# Patient Record
Sex: Female | Born: 1950 | Race: White | Hispanic: No | Smoking: Never smoker
Health system: Southern US, Community
[De-identification: ages and names within clinical notes are randomized; demographics above are authoritative.]

## PROBLEM LIST (undated history)

## (undated) DIAGNOSIS — IMO0002 Reserved for concepts with insufficient information to code with codable children: Secondary | ICD-10-CM

## (undated) DIAGNOSIS — Z9221 Personal history of antineoplastic chemotherapy: Secondary | ICD-10-CM

## (undated) DIAGNOSIS — K589 Irritable bowel syndrome without diarrhea: Secondary | ICD-10-CM

## (undated) DIAGNOSIS — Q249 Congenital malformation of heart, unspecified: Secondary | ICD-10-CM

## (undated) DIAGNOSIS — I10 Essential (primary) hypertension: Secondary | ICD-10-CM

## (undated) DIAGNOSIS — G43909 Migraine, unspecified, not intractable, without status migrainosus: Secondary | ICD-10-CM

## (undated) DIAGNOSIS — N83209 Unspecified ovarian cyst, unspecified side: Secondary | ICD-10-CM

## (undated) DIAGNOSIS — Z923 Personal history of irradiation: Secondary | ICD-10-CM

## (undated) DIAGNOSIS — C50919 Malignant neoplasm of unspecified site of unspecified female breast: Secondary | ICD-10-CM

## (undated) DIAGNOSIS — E7800 Pure hypercholesterolemia, unspecified: Secondary | ICD-10-CM

## (undated) DIAGNOSIS — R0981 Nasal congestion: Secondary | ICD-10-CM

## (undated) DIAGNOSIS — K219 Gastro-esophageal reflux disease without esophagitis: Secondary | ICD-10-CM

## (undated) DIAGNOSIS — C50511 Malignant neoplasm of lower-outer quadrant of right female breast: Principal | ICD-10-CM

## (undated) DIAGNOSIS — M722 Plantar fascial fibromatosis: Secondary | ICD-10-CM

## (undated) DIAGNOSIS — K449 Diaphragmatic hernia without obstruction or gangrene: Secondary | ICD-10-CM

## (undated) HISTORY — DX: Gastro-esophageal reflux disease without esophagitis: K21.9

## (undated) HISTORY — DX: Diaphragmatic hernia without obstruction or gangrene: K44.9

## (undated) HISTORY — DX: Pure hypercholesterolemia, unspecified: E78.00

## (undated) HISTORY — DX: Unspecified ovarian cyst, unspecified side: N83.209

## (undated) HISTORY — DX: Reserved for concepts with insufficient information to code with codable children: IMO0002

## (undated) HISTORY — PX: COLONOSCOPY: SHX174

## (undated) HISTORY — DX: Malignant neoplasm of lower-outer quadrant of right female breast: C50.511

## (undated) HISTORY — DX: Migraine, unspecified, not intractable, without status migrainosus: G43.909

## (undated) HISTORY — DX: Irritable bowel syndrome, unspecified: K58.9

## (undated) HISTORY — DX: Plantar fascial fibromatosis: M72.2

## (undated) HISTORY — PX: NASAL SINUS SURGERY: SHX719

## (undated) HISTORY — DX: Congenital malformation of heart, unspecified: Q24.9

## (undated) HISTORY — PX: APPENDECTOMY: SHX54

## (undated) HISTORY — DX: Essential (primary) hypertension: I10

---

## 1990-03-12 HISTORY — PX: ABDOMINAL HYSTERECTOMY: SHX81

## 1996-03-12 HISTORY — PX: BREAST EXCISIONAL BIOPSY: SUR124

## 1996-03-12 HISTORY — PX: BREAST CYST EXCISION: SHX579

## 1998-02-09 ENCOUNTER — Other Ambulatory Visit: Admission: RE | Admit: 1998-02-09 | Discharge: 1998-02-09 | Payer: Self-pay | Admitting: Gynecology

## 1999-01-03 ENCOUNTER — Encounter: Payer: Self-pay | Admitting: Gynecology

## 1999-01-03 ENCOUNTER — Encounter: Admission: RE | Admit: 1999-01-03 | Discharge: 1999-01-03 | Payer: Self-pay | Admitting: Gynecology

## 1999-02-15 ENCOUNTER — Other Ambulatory Visit: Admission: RE | Admit: 1999-02-15 | Discharge: 1999-02-15 | Payer: Self-pay | Admitting: Gynecology

## 2000-01-09 ENCOUNTER — Encounter: Admission: RE | Admit: 2000-01-09 | Discharge: 2000-01-09 | Payer: Self-pay | Admitting: Gynecology

## 2000-01-09 ENCOUNTER — Encounter: Payer: Self-pay | Admitting: Gynecology

## 2000-02-16 ENCOUNTER — Other Ambulatory Visit: Admission: RE | Admit: 2000-02-16 | Discharge: 2000-02-16 | Payer: Self-pay | Admitting: Gynecology

## 2000-03-12 HISTORY — PX: ESOPHAGOGASTRODUODENOSCOPY: SHX1529

## 2000-07-16 ENCOUNTER — Ambulatory Visit (HOSPITAL_COMMUNITY): Admission: RE | Admit: 2000-07-16 | Discharge: 2000-07-16 | Payer: Self-pay | Admitting: Gastroenterology

## 2000-07-16 ENCOUNTER — Encounter (INDEPENDENT_AMBULATORY_CARE_PROVIDER_SITE_OTHER): Payer: Self-pay

## 2000-09-23 ENCOUNTER — Encounter: Admission: RE | Admit: 2000-09-23 | Discharge: 2000-09-23 | Payer: Self-pay | Admitting: Family Medicine

## 2000-09-23 ENCOUNTER — Encounter: Payer: Self-pay | Admitting: Family Medicine

## 2001-01-09 ENCOUNTER — Encounter: Admission: RE | Admit: 2001-01-09 | Discharge: 2001-01-09 | Payer: Self-pay | Admitting: Gynecology

## 2001-01-09 ENCOUNTER — Encounter: Payer: Self-pay | Admitting: Gynecology

## 2001-02-20 ENCOUNTER — Other Ambulatory Visit: Admission: RE | Admit: 2001-02-20 | Discharge: 2001-02-20 | Payer: Self-pay | Admitting: Gynecology

## 2001-08-29 ENCOUNTER — Encounter: Payer: Self-pay | Admitting: Gynecology

## 2001-08-29 ENCOUNTER — Encounter: Admission: RE | Admit: 2001-08-29 | Discharge: 2001-08-29 | Payer: Self-pay | Admitting: Gynecology

## 2002-01-17 ENCOUNTER — Emergency Department (HOSPITAL_COMMUNITY): Admission: EM | Admit: 2002-01-17 | Discharge: 2002-01-17 | Payer: Self-pay | Admitting: Emergency Medicine

## 2002-01-20 ENCOUNTER — Encounter: Admission: RE | Admit: 2002-01-20 | Discharge: 2002-01-20 | Payer: Self-pay | Admitting: Gynecology

## 2002-01-20 ENCOUNTER — Encounter: Payer: Self-pay | Admitting: Gynecology

## 2002-03-02 ENCOUNTER — Other Ambulatory Visit: Admission: RE | Admit: 2002-03-02 | Discharge: 2002-03-02 | Payer: Self-pay | Admitting: Gynecology

## 2003-01-22 ENCOUNTER — Encounter: Admission: RE | Admit: 2003-01-22 | Discharge: 2003-01-22 | Payer: Self-pay | Admitting: Gynecology

## 2003-03-04 ENCOUNTER — Other Ambulatory Visit: Admission: RE | Admit: 2003-03-04 | Discharge: 2003-03-04 | Payer: Self-pay | Admitting: Gynecology

## 2003-06-28 ENCOUNTER — Encounter: Admission: RE | Admit: 2003-06-28 | Discharge: 2003-06-28 | Payer: Self-pay | Admitting: Gastroenterology

## 2003-08-13 ENCOUNTER — Encounter: Admission: RE | Admit: 2003-08-13 | Discharge: 2003-08-13 | Payer: Self-pay | Admitting: Family Medicine

## 2004-02-14 ENCOUNTER — Encounter: Admission: RE | Admit: 2004-02-14 | Discharge: 2004-02-14 | Payer: Self-pay | Admitting: Gynecology

## 2004-03-14 ENCOUNTER — Other Ambulatory Visit: Admission: RE | Admit: 2004-03-14 | Discharge: 2004-03-14 | Payer: Self-pay | Admitting: Gynecology

## 2005-02-23 ENCOUNTER — Encounter: Admission: RE | Admit: 2005-02-23 | Discharge: 2005-02-23 | Payer: Self-pay | Admitting: Gynecology

## 2005-04-02 ENCOUNTER — Other Ambulatory Visit: Admission: RE | Admit: 2005-04-02 | Discharge: 2005-04-02 | Payer: Self-pay | Admitting: Gynecology

## 2006-02-25 ENCOUNTER — Encounter: Admission: RE | Admit: 2006-02-25 | Discharge: 2006-02-25 | Payer: Self-pay | Admitting: Gynecology

## 2006-03-08 ENCOUNTER — Encounter: Admission: RE | Admit: 2006-03-08 | Discharge: 2006-03-08 | Payer: Self-pay | Admitting: Gynecology

## 2006-04-04 ENCOUNTER — Other Ambulatory Visit: Admission: RE | Admit: 2006-04-04 | Discharge: 2006-04-04 | Payer: Self-pay | Admitting: Gynecology

## 2006-09-05 ENCOUNTER — Encounter: Admission: RE | Admit: 2006-09-05 | Discharge: 2006-09-05 | Payer: Self-pay | Admitting: Gynecology

## 2007-03-04 ENCOUNTER — Encounter: Admission: RE | Admit: 2007-03-04 | Discharge: 2007-03-04 | Payer: Self-pay | Admitting: Gynecology

## 2007-03-13 HISTORY — PX: BILATERAL OOPHORECTOMY: SHX1221

## 2007-03-13 HISTORY — PX: TUBAL LIGATION: SHX77

## 2007-05-06 ENCOUNTER — Ambulatory Visit (HOSPITAL_BASED_OUTPATIENT_CLINIC_OR_DEPARTMENT_OTHER): Admission: RE | Admit: 2007-05-06 | Discharge: 2007-05-06 | Payer: Self-pay | Admitting: Gynecology

## 2007-05-06 ENCOUNTER — Encounter (INDEPENDENT_AMBULATORY_CARE_PROVIDER_SITE_OTHER): Payer: Self-pay | Admitting: Gynecology

## 2008-03-08 ENCOUNTER — Encounter: Admission: RE | Admit: 2008-03-08 | Discharge: 2008-03-08 | Payer: Self-pay | Admitting: Gynecology

## 2009-03-09 ENCOUNTER — Encounter: Admission: RE | Admit: 2009-03-09 | Discharge: 2009-03-09 | Payer: Self-pay | Admitting: Gynecology

## 2009-03-16 ENCOUNTER — Encounter: Admission: RE | Admit: 2009-03-16 | Discharge: 2009-03-16 | Payer: Self-pay | Admitting: Gynecology

## 2010-03-10 ENCOUNTER — Encounter
Admission: RE | Admit: 2010-03-10 | Discharge: 2010-03-10 | Payer: Self-pay | Source: Home / Self Care | Attending: Gynecology | Admitting: Gynecology

## 2010-04-02 ENCOUNTER — Encounter: Payer: Self-pay | Admitting: Gynecology

## 2010-04-24 ENCOUNTER — Other Ambulatory Visit: Payer: Self-pay

## 2010-04-27 ENCOUNTER — Other Ambulatory Visit: Payer: Self-pay | Admitting: Gynecology

## 2010-07-25 NOTE — Op Note (Signed)
NAME:  Tammy Day, Tammy Day            ACCOUNT NO.:  [id number]   MEDICAL RECORD NO.:  [id number]          PATIENT TYPE:  AMB   LOCATION:  [facility]                         FACILITY:  [facility]   PHYSICIAN:  Gretta Cool, M.D. DATE OF BIRTH:  [birth year]   DATE OF PROCEDURE:  05/06/2007  DATE OF DISCHARGE:                               OPERATIVE REPORT   PREOPERATIVE DIAGNOSES:  1. Neoplastic left adnexal mass, 5 x 7 cm.  2. Contralateral small right complex adnexal structure.  3. Chest wall inflammatory sebaceous cyst.   POSTOPERATIVE DIAGNOSES:  1. Neoplastic left adnexal mass, 5 x 7 cm.  2. Contralateral small right complex adnexal structure.  3. Chest wall inflammatory sebaceous cyst.   PROCEDURES:  1. Diagnostic laparoscopy.  2. Lysis of adhesions, omentum from the abdominal wall, left colon      from the ovary and lateral pelvic wall.  3. Ureterolysis, left.  4. Left salpingo-oophorectomy, aspiration of the cyst fluid and      EndoCatch removal of the left ovary and tube.  5. Right salpingo-oophorectomy and EndoCatch removal.  6. Wide excision of recurring sebaceous cyst, left lateral chest wall.   SURGEON:  Gretta Cool, M.D.   ASSISTANT:  Providence Lanius, R.N.   ANESTHESIA:  General endotracheal and local Marcaine 0.25%   DESCRIPTION OF PROCEDURE:  Under excellent general anesthesia with the  patient prepped and draped in Allen stirrups, lithotomy position, the  Veress cannula was introduced and adequate pneumoperitoneum obtained by  carbon dioxide.  The laparoscope trocar was then introduced and the  pelvic organs visualized.  There were adhesions of the omentum and bowel  mesentery to the left lateral pelvic wall.  The colon was adherent over  the left ovary and fallopian tube.  Port sites were placed in the lower  quadrants bilaterally under direct vision.  The tripolar forceps was  then introduced and the adhesions progressively dissected and  cauterized, transected  and lysed by blunt and sharp dissection.  Hydrodissection was also used to delineate the landmarks and planes.  The anterior abdominal wall adhesions were released easily.  The left  colon adhesions were much more dense.  The colon was mobilized from over  the ovary and the ovary progressively exposed.  The round ligament was  then transected by tripolar forceps and the infundibulopelvic vessels  isolated, clamped and cauterized and then transected with tripolar  forceps.  The left ureter was noted coursing over the ovary.  It was  unroofed and dissected from the surface of the ovary.  The ovary was  thus completely mobilized and eventually completely released from all  adhesions.  At this point a needle was placed into the cyst and the cyst  evacuated.  A grasper was then used to close the ovary at the aspiration  site.  The ovary was then placed in an EndoCatch pouch and delivered  through the abdominal 10-mm port site.  At this point, irrigation of the  left pelvic floor revealed no significant bleeding.  The pelvis was  irrigated free of all fluid and attention turned to the right ovary.  There  was an area on the right ovary that appeared compatible with the  ultrasound finding of a complex cystic structure.  Because of that  finding, a decision was made to remove the right ovary.  The  infundibulopelvic vessels were isolated, cauterized and then transected  with tripolar forceps.  The attachment to the round ligament was then  also transected and the adventitial adhesions were released with  tripolar forceps.  At this point the right ovary was also delivered into  an EndoCatch bag and delivered through the abdominal umbilical port.  At  this point all of the pedicle sites were reexamined, irrigated with  lactated Ringer's and all of the fluid removed.  There was no bleeding  from any site.  At this point the 10-mm port site was closed with a 0  Vicryl UR-6.  All three of the port  sites were then sutured closed with  5-0 Vicryl and Steri-Strips.   The patient's drapes were repositioned.  She was reprepped and draped  for the excision of the anterior chest wall lesion.  A wide excision of  the recurring abscess site was made and excised through the entire  thickness of skin and subcutaneous tissue down to 7-8 mm.  At this point  the subcutaneous tissue was approximated with subcuticular of 2-0 Vicryl  and the skin closed with mattress sutures of 4-0 Ethilon.  At this point  the incision was closed with Steri-Strips, a pressure dressing applied  and the patient returned to the recovery room in excellent condition  without complication.           ______________________________  Gretta Cool, M.D.     CWL/MEDQ  D:  05/06/2007  T:  05/07/2007  Job:  [id number]   cc:   L. Lupe Carney, M.D.  Fax: [phone]

## 2010-07-28 NOTE — Procedures (Signed)
[facility]  Patient:    Tammy Day, Tammy Day                   MRN: [id number] Proc. Date: 07/16/00 Adm. Date:  [id number] Attending:  Nelda Marseille CC:         Abran Cantor. Clovis Riley, MD   Procedure Report  PROCEDURE:  Colonoscopy.  INDICATIONS:  Chronic diarrhea.  Family history of colon polyps.  Some GI bleeding on aspirin.  INFORMED CONSENT:  Consent was signed after risk, benefits, methods and options were thoroughly discussed in the office.  MEDICINES USED:  Demerol 80 mg, Versed 8 mg.  DESCRIPTION OF PROCEDURE:  Rectal inspection was pertinent for external hemorrhoids, small.  Digital exam was negative.  The video pediatric colonoscope was inserted and despite a very tortuous colon was able to advance to the cecum.  This did require some abdominal pressure but no position changes.  The cecum was identified by the appendiceal orifice and the ileocecal valve.  No obvious abnormality was seen on insertion.  The scope was inserted a short ways into the terminal ileum which was normal. Photodocumentation and a few biopsies were obtained.  The scope was slowly withdrawn.  The prep was adequate.  There was some liquid stool that required washing and suctioning.  On slow withdrawal through the colon, some random colon biopsies were obtained, but no abnormalities were seen as we slowly withdrew back to the rectum.  In the rectum, a small rectal polyp was seen which was cold biopsied x 2 and put in a separate container.  Once back in the rectum, the scope was retroflexed, pertinent for some internal hemorrhoids. The scope was straightened, air was withdrawn, and the scope removed.  The patient tolerated the procedure well, and there was no obvious immediate complication.  ENDOSCOPIC DIAGNOSES: 1. Internal and external hemorrhoids. 2. Tortuous colon. 3. Tiny rectal polyp, cold biopsied. 4. Otherwise within normal limits to the terminal ileum, status  post random    biopsies throughout.  PLAN:  Await pathology and continue work-up for an EGD. DD:  07/16/00 TD:  07/16/00 Job: [id number]

## 2010-07-28 NOTE — Procedures (Signed)
[facility]  Patient:    Tammy Day, Tammy Day                   MRN: [id number] Proc. Date: 07/16/00 Adm. Date:  [id number] Attending:  Nelda Marseille CC:         Abran Cantor. Clovis Riley, MD   Procedure Report  PROCEDURE:  Esophagogastroduodenoscopy with biopsy.  INDICATION:  Patient with GI bleeding, nondiagnostic colonoscopy, on aspirin.  INFORMED CONSENT:  Consent was signed after risks, benefits, methods, and options were thoroughly discussed in the office.  MEDICINES USED:  Additionally, since this followed the colonoscopy were 20 of Demerol and 2 of Versed.  DESCRIPTION OF PROCEDURE:  The video endoscope was inserted by direct vision. The esophagus was normal.  She did not have an obvious hiatal hernia but did have a widely patent GE junction.  The scope passed easily into the stomach and advanced to the antrum pertinent for some small antral erosions and one small antral ulcer.  The scope passed through a normal pylorus into a normal duodenal bulb and around the c-loop to a normal second portion of the duodenum. The scope was withdrawn back to the bulb, and a good look there ruled out ulcers in that location.  The scope was withdrawn back to the stomach and retroflexed.  Angularis, cardia, fundus, and lesser and greater curve were all normal on retroflex visualization.  The scope was straightened, and straight visualization of the stomach was normal.  The scope was then advanced to the antrum.  Two biopsies for CLOtest were obtained.  The scope was then advanced into the second portion of the duodenum.  A few distal biopsies were obtained because of her diarrhea, and then the scope was withdrawn back to the antrum, and multiple antral biopsies were obtained, mostly around the ulcer.  We then went ahead and took two proximal stomach biopsies and put it in with the antral ulcer to rule out Helicobacter as well. Air was suctioned, and the scope was  slowly withdrawn.  Again, a good look at the esophagus on slow withdrawal was normal.  The scope was removed.  The patient tolerated the procedure well.  There was no obvious immediate complications.  ENDOSCOPIC DIAGNOSES: 1. Patent gastroesophageal junction but no obvious hiatal hernia. 2. Small antral ulcer and erosions, status post CLOtest and biopsy. 3. Otherwise within normal limits EGD, status post proximal stomach biopsy and    mid duodenal biopsy to rule out microscopic abnormalities.  PLAN:  Await pathology.  No aspirin or nonsteroidals.  Tylenol only.  Will go ahead and refill her Nexium and follow up in six weeks to recheck CBC, guaiacs, symptoms, and make sure no further work-up plans are needed and have her call me sooner p.r.n. DD:  07/16/00 TD:  07/16/00 Job: [id number]

## 2010-12-01 LAB — POCT HEMOGLOBIN-HEMACUE
Hemoglobin: 13.9
Operator id: 114531

## 2011-02-27 ENCOUNTER — Other Ambulatory Visit: Payer: Self-pay | Admitting: Gynecology

## 2011-02-27 DIAGNOSIS — Z1231 Encounter for screening mammogram for malignant neoplasm of breast: Secondary | ICD-10-CM

## 2011-03-14 ENCOUNTER — Ambulatory Visit
Admission: RE | Admit: 2011-03-14 | Discharge: 2011-03-14 | Disposition: A | Discharge status: Home / Self Care | Payer: BC Managed Care – PPO | Source: Ambulatory Visit | Attending: Gynecology | Admitting: Gynecology

## 2011-03-14 DIAGNOSIS — Z1231 Encounter for screening mammogram for malignant neoplasm of breast: Secondary | ICD-10-CM

## 2011-05-08 ENCOUNTER — Other Ambulatory Visit: Payer: Self-pay | Admitting: Gynecology

## 2012-02-18 ENCOUNTER — Other Ambulatory Visit: Payer: Self-pay | Admitting: Gynecology

## 2012-02-18 DIAGNOSIS — Z1231 Encounter for screening mammogram for malignant neoplasm of breast: Secondary | ICD-10-CM

## 2012-03-26 ENCOUNTER — Ambulatory Visit
Admission: RE | Admit: 2012-03-26 | Discharge: 2012-03-26 | Disposition: A | Discharge status: Home / Self Care | Payer: BC Managed Care – PPO | Source: Ambulatory Visit | Attending: Gynecology | Admitting: Gynecology

## 2012-03-26 DIAGNOSIS — Z1231 Encounter for screening mammogram for malignant neoplasm of breast: Secondary | ICD-10-CM

## 2012-05-13 ENCOUNTER — Other Ambulatory Visit: Payer: Self-pay | Admitting: Gynecology

## 2012-11-14 ENCOUNTER — Other Ambulatory Visit: Payer: Self-pay | Admitting: Gastroenterology

## 2013-02-25 ENCOUNTER — Other Ambulatory Visit: Payer: Self-pay

## 2013-02-25 DIAGNOSIS — Z1231 Encounter for screening mammogram for malignant neoplasm of breast: Secondary | ICD-10-CM

## 2013-03-30 ENCOUNTER — Ambulatory Visit
Admission: RE | Admit: 2013-03-30 | Discharge: 2013-03-30 | Disposition: A | Discharge status: Home / Self Care | Payer: BC Managed Care – PPO | Source: Ambulatory Visit

## 2013-03-30 DIAGNOSIS — Z1231 Encounter for screening mammogram for malignant neoplasm of breast: Secondary | ICD-10-CM

## 2013-05-01 ENCOUNTER — Ambulatory Visit: Payer: BC Managed Care – PPO | Admitting: Cardiovascular Disease

## 2013-05-04 ENCOUNTER — Encounter: Payer: Self-pay | Admitting: *Deleted

## 2013-05-05 ENCOUNTER — Ambulatory Visit: Payer: BC Managed Care – PPO | Admitting: Cardiovascular Disease

## 2013-05-19 ENCOUNTER — Encounter: Payer: Self-pay | Admitting: Cardiovascular Disease

## 2013-05-19 ENCOUNTER — Ambulatory Visit (INDEPENDENT_AMBULATORY_CARE_PROVIDER_SITE_OTHER): Payer: BC Managed Care – PPO | Admitting: Cardiovascular Disease

## 2013-05-19 VITALS — Ht 64.0 in | Wt 162.0 lb

## 2013-05-19 DIAGNOSIS — R079 Chest pain, unspecified: Secondary | ICD-10-CM

## 2013-05-19 DIAGNOSIS — I77810 Thoracic aortic ectasia: Secondary | ICD-10-CM

## 2013-05-19 DIAGNOSIS — E785 Hyperlipidemia, unspecified: Secondary | ICD-10-CM

## 2013-05-19 DIAGNOSIS — I10 Essential (primary) hypertension: Secondary | ICD-10-CM

## 2013-05-19 NOTE — Assessment & Plan Note (Signed)
Intolerant to 3 statins wit leg and right shoulder myalgias  Continue welchol at lower doses as this causes indigestion Labs with Dr Alroy Dust

## 2013-05-19 NOTE — Assessment & Plan Note (Signed)
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

## 2013-05-19 NOTE — Patient Instructions (Signed)
Your physician recommends that you schedule a follow-up appointment in: AS NEEDED  Your physician recommends that you continue on your current medications as directed. Please refer to the Current Medication list given to you today. Your physician has requested that you have en exercise stress myoview. For further information please visit [email or web address]. Please follow instruction sheet, as given.  

## 2013-05-19 NOTE — Assessment & Plan Note (Signed)
Well controlled.  Continue current medications and low sodium Dash type diet.    

## 2013-05-19 NOTE — Assessment & Plan Note (Signed)
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  And see what issue is with aortic root

## 2013-05-19 NOTE — Progress Notes (Signed)
Patient ID: Tammy Day, female   DOB: 1950/07/26, 63 y.o.   MRN: [id number]  63 yo patient of Dr Donnie Coffin referred for chest pain  CRF;s elevated lipids and HTN  Latter on rx but intolerant to statins   Pain last 2 months. Intermittant  Lots of times radiates to jaw.  Dentist feels she may clench her teeth and have some TMJ  Some family and work related stress.  ECG is abnormal with biphasic precordial T waves Only one ECG available but Dr Rexanne Mano note indicates changes are old or unchanged  She apparently had echo this past fall that I do not have a copy of  She indicated aortic root may have been dilated and he recommended f/u echo in a year.  She is to see a dentist soon to see if she needs a bite guard for sleep.  She has hiatal hernia and reflux that her welcohl makes worse .  Gets similar pain wit spicy food  Pain infrequently related to exertion      ROS: Denies fever, malais, weight loss, blurry vision, decreased visual acuity, cough, sputum, SOB, hemoptysis, pleuritic pain, palpitaitons, heartburn, abdominal pain, melena, lower extremity edema, claudication, or rash.  All other systems reviewed and negative   General: Affect appropriate Healthy:  appears stated age 63: normal Neck supple with no adenopathy JVP normal no bruits no thyromegaly Lungs clear with no wheezing and good diaphragmatic motion Heart:  S1/S2 no murmur,rub, gallop or click PMI normal Abdomen: benighn, BS positve, no tenderness, no AAA no bruit.  No HSM or HJR Distal pulses intact with no bruits No edema Neuro non-focal Skin warm and dry No muscular weakness  Medications Current Outpatient Prescriptions  Medication Sig Dispense Refill  . aspirin 81 MG tablet Take 81 mg by mouth daily.      . B Complex Vitamins (VITAMIN B COMPLEX PO) Take by mouth daily.      . Calcium-Vitamin D-Vitamin K (VIACTIV PO) Take by mouth daily.      . cholestyramine (QUESTRAN) 4 GM/DOSE powder Take by mouth daily.       . colesevelam (WELCHOL) 625 MG tablet Take by mouth 2 (two) times daily with a meal. 1 tab in the am, 2 in the pm.      . Multiple Vitamins-Minerals (HM MULTIVITAMIN ADULT GUMMY PO) Take by mouth daily.      . Omega-3 Fatty Acids (FISH OIL) 1200 MG CAPS Take by mouth daily.      . RABEprazole (ACIPHEX) 20 MG tablet Take 20 mg by mouth every other day.      . telmisartan-hydrochlorothiazide (MICARDIS HCT) 80-25 MG per tablet Take by mouth daily. 1/2 tab daily      . vitamin A 10000 UNIT capsule Take 10,000 Units by mouth daily.       No current facility-administered medications for this visit.    Allergies Atorvastatin; Nexium; Pravastatin; Prilosec; and Rabeprazole  Family History: No family history on file.  Social History: History   Social History  . Marital Status: Married    Spouse Name: N/A    Number of Children: N/A  . Years of Education: N/A   Occupational History  . Not on file.   Social History Main Topics  . Smoking status: Not on file  . Smokeless tobacco: Not on file  . Alcohol Use: Not on file  . Drug Use: Not on file  . Sexual Activity: Not on file   Other Topics Concern  .  Not on file   Social History Narrative  . No narrative on file    Electrocardiogram:  SR rate 81  Anterolateral T wave changes biphasic  Tall R wave in V1  Assessment and Plan

## 2013-06-03 ENCOUNTER — Ambulatory Visit (HOSPITAL_COMMUNITY): Payer: BC Managed Care – PPO | Attending: Cardiology | Admitting: Radiology

## 2013-06-03 ENCOUNTER — Encounter: Payer: Self-pay | Admitting: Cardiology

## 2013-06-03 VITALS — BP 109/79 | Ht 64.0 in | Wt 162.0 lb

## 2013-06-03 DIAGNOSIS — R079 Chest pain, unspecified: Secondary | ICD-10-CM | POA: Insufficient documentation

## 2013-06-03 MED ORDER — TECHNETIUM TC 99M SESTAMIBI GENERIC - CARDIOLITE
33.0000 | Freq: Once | INTRAVENOUS | Status: AC | PRN
Start: 2013-06-03 — End: 2013-06-03
  Administered 2013-06-03: 33 via INTRAVENOUS

## 2013-06-03 MED ORDER — TECHNETIUM TC 99M SESTAMIBI GENERIC - CARDIOLITE
11.0000 | Freq: Once | INTRAVENOUS | Status: AC | PRN
Start: 2013-06-03 — End: 2013-06-03
  Administered 2013-06-03: 11 via INTRAVENOUS

## 2013-06-03 NOTE — Progress Notes (Signed)
[facility] [address] [place], [place] [address] [phone]    Cardiology Nuclear Med Study  Tammy Day is a 63 y.o. female     MRN : [id number]     DOB: 03-13-1950  Procedure Date: 06/03/2013  Nuclear Med Background Indication for Stress Test:  Evaluation for Ischemia and Abnormal EKG History:  '08 MPI: Tammy Day NL 8/14 EF: NL ECHO: 8/14 Cardiac Risk Factors: Family History - CAD, Hypertension and Lipids  Symptoms:  Chest Pain   Nuclear Pre-Procedure Caffeine/Decaff Intake:  None NPO After: 6:30am   Lungs:  clear O2 Sat: 98% on room air. IV 0.9% NS with Angio Cath:  22g  IV Site: R Antecubital  IV Started by:  Crissie Figures, RN  Chest Size (in):  38 Cup Size: C  Height: 5\' 4"  (1.626 m)  Weight:  162 lb (73.483 kg)  BMI:  Body mass index is 27.79 kg/(m^2). Tech Comments:  N/A    Nuclear Med Study 1 or 2 day study: 1 day  Stress Test Type:  Stress  Reading MD: N/A  Order Authorizing Provider:  Jenkins Rouge, MD  Resting Radionuclide: Technetium 37m Sestamibi  Resting Radionuclide Dose: 11.0 mCi   Stress Radionuclide:  Technetium 41m Sestamibi  Stress Radionuclide Dose: 33.0 mCi           Stress Protocol Rest HR: 68 Stress HR: 144  Rest BP: 109/79 Stress BP: 140/78  Exercise Time (min): 8:00 METS: 10.10   Predicted Max HR: 157 bpm % Max HR: 91.72 bpm Rate Pressure Product: 20160   Dose of Adenosine (mg):  n/a Dose of Lexiscan: n/a mg  Dose of Atropine (mg): n/a Dose of Dobutamine: n/a mcg/kg/min (at max HR)  Stress Test Technologist: Perrin Maltese, EMT-P  Nuclear Technologist:  Charlton Amor, CNMT     Rest Procedure:  Myocardial perfusion imaging was performed at rest 45 minutes following the intravenous administration of Technetium 85m Sestamibi. Rest ECG: NSR - Normal EKG  Stress Procedure:  The patient exercised on the treadmill utilizing the Bruce Protocol for 8:00 minutes. The patient stopped due to sob and denied  any chest pain.  Technetium 27m Sestamibi was injected at peak exercise and myocardial perfusion imaging was performed after a brief delay. Stress ECG: No significant ST segment change suggestive of ischemia. Occasional PVC's.  QPS Raw Data Images:  Normal; no motion artifact; normal heart/lung ratio. Stress Images:  There is decreased uptake in the anterior wall. Rest Images:  There is decreased uptake in the anterior wall. Subtraction (SDS):  There is a fixed anteriour defect that is most consistent with breast attenuation. Transient Ischemic Dilatation (Normal <1.22):  0.93 Lung/Heart Ratio (Normal <0.45):  0.36  Quantitative Gated Spect Images QGS EDV:  56 ml QGS ESV:  13 ml  Impression Exercise Capacity:  Good exercise capacity. BP Response:  Normal blood pressure response. Clinical Symptoms:  There is dyspnea. ECG Impression:  No significant ST segment change suggestive of ischemia. Comparison with Prior Nuclear Study: No significant change from previous study  Overall Impression:  Low risk stress nuclear study with small area of anterior breast attenuation artifact.  Good exercise capacity. Duke Treadmill score +9.  LV Ejection Fraction: 77%.  LV Wall Motion:  Normal Wall Motion  [name], MD, [facility] Board Certified in Nuclear Cardiology Attending Cardiologist [place]

## 2013-06-08 ENCOUNTER — Encounter: Payer: Self-pay | Admitting: *Deleted

## 2013-11-02 ENCOUNTER — Other Ambulatory Visit (HOSPITAL_COMMUNITY): Payer: BC Managed Care – PPO

## 2013-11-05 ENCOUNTER — Other Ambulatory Visit (HOSPITAL_COMMUNITY): Payer: Self-pay | Admitting: Cardiology

## 2013-11-05 DIAGNOSIS — I77810 Thoracic aortic ectasia: Secondary | ICD-10-CM

## 2013-11-06 ENCOUNTER — Ambulatory Visit (HOSPITAL_COMMUNITY): Payer: BC Managed Care – PPO | Attending: Cardiology | Admitting: Cardiology

## 2013-11-06 DIAGNOSIS — E785 Hyperlipidemia, unspecified: Secondary | ICD-10-CM | POA: Diagnosis not present

## 2013-11-06 DIAGNOSIS — I10 Essential (primary) hypertension: Secondary | ICD-10-CM | POA: Diagnosis not present

## 2013-11-06 DIAGNOSIS — I77810 Thoracic aortic ectasia: Secondary | ICD-10-CM

## 2013-11-06 NOTE — Progress Notes (Signed)
Echo performed. 

## 2013-11-11 ENCOUNTER — Telehealth: Payer: Self-pay | Admitting: *Deleted

## 2013-11-11 NOTE — Telephone Encounter (Signed)
No she does not need yearly echo

## 2013-11-11 NOTE — Telephone Encounter (Signed)
Spoke with pt and reviewed echo results with her. She is following up with Dr. Johnsie Cancel as needed. She is asking if she needs yearly echo?

## 2013-11-11 NOTE — Telephone Encounter (Signed)
Left message to call back  

## 2013-11-11 NOTE — Telephone Encounter (Signed)
LMTCB ./CY 

## 2013-11-12 NOTE — Telephone Encounter (Signed)
PT  NOTIFIED  DOES NOT REQUIRE ECHO YEARLY PER DR NISHAN./CY

## 2014-03-10 ENCOUNTER — Other Ambulatory Visit: Payer: Self-pay

## 2014-03-10 DIAGNOSIS — Z1231 Encounter for screening mammogram for malignant neoplasm of breast: Secondary | ICD-10-CM

## 2014-03-12 DIAGNOSIS — Z9221 Personal history of antineoplastic chemotherapy: Secondary | ICD-10-CM

## 2014-03-12 DIAGNOSIS — C50919 Malignant neoplasm of unspecified site of unspecified female breast: Secondary | ICD-10-CM

## 2014-03-12 HISTORY — DX: Personal history of antineoplastic chemotherapy: Z92.21

## 2014-03-12 HISTORY — DX: Malignant neoplasm of unspecified site of unspecified female breast: C50.919

## 2014-03-29 ENCOUNTER — Encounter: Payer: Self-pay | Admitting: Podiatry

## 2014-03-29 ENCOUNTER — Other Ambulatory Visit: Payer: Self-pay | Admitting: Podiatry

## 2014-03-29 ENCOUNTER — Ambulatory Visit (INDEPENDENT_AMBULATORY_CARE_PROVIDER_SITE_OTHER): Payer: BC Managed Care – PPO | Admitting: Podiatry

## 2014-03-29 ENCOUNTER — Ambulatory Visit (INDEPENDENT_AMBULATORY_CARE_PROVIDER_SITE_OTHER): Payer: BC Managed Care – PPO

## 2014-03-29 VITALS — BP 124/82 | HR 80 | Resp 12 | Ht 65.0 in | Wt 160.0 lb

## 2014-03-29 DIAGNOSIS — M79672 Pain in left foot: Secondary | ICD-10-CM

## 2014-03-29 DIAGNOSIS — M722 Plantar fascial fibromatosis: Secondary | ICD-10-CM

## 2014-03-29 MED ORDER — DICLOFENAC SODIUM 75 MG PO TBEC: 75.0000 mg | DELAYED_RELEASE_TABLET | Freq: Two times a day (BID) | ORAL | Status: DC

## 2014-03-29 NOTE — Progress Notes (Signed)
   Subjective:    Patient ID: Tammy Day, female    DOB: [birth year], 64 y.o.   MRN: [id number]  HPI Comments: N heel pain L left plantar heel D Fall 2015 O worsened over Thanksgiving 2015 C constant dull pain A worse after resting and in the evening T [place], Dr. Alroy Dust ordered icing and rest.  Foot Pain      Review of Systems  All other systems reviewed and are negative.      Objective:   Physical Exam  Orientated 3  Vascular: DP pulses 2/4 bilaterally PT pulses 2/4 bilaterally  Neurological: Sensation to 10 g monofilament wire intact 5/5 bilaterally Ankle reflex equal and reactive bilaterally  Dermatological: Texture and turgor within normal limits bilaterally  Musculoskeletal: HAV deformity right Palpable tenderness medial plantar fascial insertional area leftt without any palpable lesions. This duplicates areas of patient's symptoms. Patient has a painful gait pattern favoring the left foot  X-ray examination left foot  Intact bony structure without fracture and/or dislocation Pes planus Well-organized inferior calcaneal spur  Radiographic impression: No acute bony abnormality noted left foot     Assessment & Plan:    Assessment: Plantar fasciitis left  Plan: Offered patient Kenalog injection and she verbally consents. The skin is prepped with alcohol and Betadine and 10 mg of Kenalog mixed with 10 mg of plain Xylocaine and 2.5 mg of plain Marcaine were injected inferior heel left for Kenalog injection #1. Patient tolerated procedure without any difficulty  Plantar fasciitis strap dispensed to wear on left foot  Diclofenac sodium 75 mg #60 by mouth one twice a day. I recommended the patient use diclofenac for 7 days and discontinue of symptoms improve, however, continue with needed  Shoeing and stretching discussed   Reappoint 4 weeks

## 2014-03-29 NOTE — Patient Instructions (Addendum)
Start diclofenac sodium 75 mg by mouth 1 twice a day 7 days continue if needed  Bent - Knee Calf Stretch  1) Stand an arm's length away from a wall. Place the palms of your hands on the wall. Step forward about 12 inches with the opposite foot.  2) Keeping toes pointed forward and both heels on the floor, bend both knees and lean forward. Hold this position for 60 seconds. Don't arch your back and don't hunch your shoulders.  3) Repeat this twice.  DO THIS STRETCHING TECHNIQUE 3 TIMES A DAY.   Stretching Exercises before Standing  Pull your toes up toward your nose and hold for 1 minute before standing.  A towel can assist with this exercise if you put the towel under the ball of your foot. This exercise reduces the intense   pain associated when changing from a seated to a standing position. This stretch can usually be the most beneficial if done before getting out of bed in the mornings. Plantar Fasciitis Plantar fasciitis is a common condition that causes foot pain. It is soreness (inflammation) of the band of tough fibrous tissue on the bottom of the foot that runs from the heel bone (calcaneus) to the ball of the foot. The cause of this soreness may be from excessive standing, poor fitting shoes, running on hard surfaces, being overweight, having an abnormal walk, or overuse (this is common in runners) of the painful foot or feet. It is also common in aerobic exercise dancers and ballet dancers. SYMPTOMS  Most people with plantar fasciitis complain of:  Severe pain in the morning on the bottom of their foot especially when taking the first steps out of bed. This pain recedes after a few minutes of walking.  Severe pain is experienced also during walking following a long period of inactivity.  Pain is worse when walking barefoot or up stairs DIAGNOSIS   Your caregiver will diagnose this condition by examining and feeling your foot.  Special tests such as X-rays of your foot, are  usually not needed. PREVENTION   Consult a sports medicine professional before beginning a new exercise program.  Walking programs offer a good workout. With walking there is a lower chance of overuse injuries common to runners. There is less impact and less jarring of the joints.  Begin all new exercise programs slowly. If problems or pain develop, decrease the amount of time or distance until you are at a comfortable level.  Wear good shoes and replace them regularly.  Stretch your foot and the heel cords at the back of the ankle (Achilles tendon) both before and after exercise.  Run or exercise on even surfaces that are not hard. For example, asphalt is better than pavement.  Do not run barefoot on hard surfaces.  If using a treadmill, vary the incline.  Do not continue to workout if you have foot or joint problems. Seek professional help if they do not improve. HOME CARE INSTRUCTIONS   Avoid activities that cause you pain until you recover.  Use ice or cold packs on the problem or painful areas after working out.  Only take over-the-counter or prescription medicines for pain, discomfort, or fever as directed by your caregiver.  Soft shoe inserts or athletic shoes with air or gel sole cushions may be helpful.  If problems continue or become more severe, consult a sports medicine caregiver or your own health care provider. Cortisone is a potent anti-inflammatory medication that may be injected into  the painful area. You can discuss this treatment with your caregiver. MAKE SURE YOU:   Understand these instructions.  Will watch your condition.  Will get help right away if you are not doing well or get worse. Document Released: 11/21/2000 Document Revised: 05/21/2011 Document Reviewed: 01/21/2008 [facility] Patient Information 2015 [place], [place]. This information is not intended to replace advice given to you by your health care provider. Make sure you discuss any questions you  have with your health care provider.

## 2014-03-30 ENCOUNTER — Encounter: Payer: Self-pay | Admitting: Podiatry

## 2014-03-30 MED ORDER — TRIAMCINOLONE ACETONIDE 10 MG/ML IJ SUSP
10.0000 mg | Freq: Once | INTRAMUSCULAR | Status: AC
  Administered 2014-03-29: 10 mg

## 2014-04-05 ENCOUNTER — Ambulatory Visit
Admission: RE | Admit: 2014-04-05 | Discharge: 2014-04-05 | Disposition: A | Discharge status: Home / Self Care | Payer: BC Managed Care – PPO | Source: Ambulatory Visit

## 2014-04-05 ENCOUNTER — Other Ambulatory Visit: Payer: Self-pay

## 2014-04-05 DIAGNOSIS — Z1231 Encounter for screening mammogram for malignant neoplasm of breast: Secondary | ICD-10-CM

## 2014-04-07 ENCOUNTER — Other Ambulatory Visit: Payer: Self-pay | Admitting: Gynecology

## 2014-04-07 DIAGNOSIS — R928 Other abnormal and inconclusive findings on diagnostic imaging of breast: Secondary | ICD-10-CM

## 2014-04-19 ENCOUNTER — Ambulatory Visit
Admission: RE | Admit: 2014-04-19 | Discharge: 2014-04-19 | Disposition: A | Discharge status: Home / Self Care | Payer: BC Managed Care – PPO | Source: Ambulatory Visit | Attending: Gynecology | Admitting: Gynecology

## 2014-04-19 ENCOUNTER — Other Ambulatory Visit: Payer: Self-pay | Admitting: Gynecology

## 2014-04-19 DIAGNOSIS — N6310 Unspecified lump in the right breast, unspecified quadrant: Secondary | ICD-10-CM

## 2014-04-19 DIAGNOSIS — R928 Other abnormal and inconclusive findings on diagnostic imaging of breast: Secondary | ICD-10-CM

## 2014-04-28 ENCOUNTER — Other Ambulatory Visit: Payer: Self-pay

## 2014-04-28 ENCOUNTER — Other Ambulatory Visit: Payer: Self-pay | Admitting: Gynecology

## 2014-04-28 DIAGNOSIS — N6310 Unspecified lump in the right breast, unspecified quadrant: Secondary | ICD-10-CM

## 2014-04-30 ENCOUNTER — Ambulatory Visit
Admission: RE | Admit: 2014-04-30 | Discharge: 2014-04-30 | Disposition: A | Discharge status: Home / Self Care | Payer: BC Managed Care – PPO | Source: Ambulatory Visit | Attending: Gynecology | Admitting: Gynecology

## 2014-04-30 DIAGNOSIS — N6310 Unspecified lump in the right breast, unspecified quadrant: Secondary | ICD-10-CM

## 2014-05-03 ENCOUNTER — Ambulatory Visit
Admission: RE | Admit: 2014-05-03 | Discharge: 2014-05-03 | Disposition: A | Discharge status: Home / Self Care | Payer: BC Managed Care – PPO | Source: Ambulatory Visit | Attending: Gynecology | Admitting: Gynecology

## 2014-05-03 ENCOUNTER — Other Ambulatory Visit: Payer: Self-pay | Admitting: Gynecology

## 2014-05-03 ENCOUNTER — Ambulatory Visit: Payer: BC Managed Care – PPO | Admitting: Podiatry

## 2014-05-03 DIAGNOSIS — C50911 Malignant neoplasm of unspecified site of right female breast: Secondary | ICD-10-CM

## 2014-05-04 ENCOUNTER — Encounter: Payer: Self-pay | Admitting: *Deleted

## 2014-05-04 ENCOUNTER — Inpatient Hospital Stay: Admission: RE | Admit: 2014-05-04 | Payer: BC Managed Care – PPO | Source: Ambulatory Visit

## 2014-05-04 ENCOUNTER — Telehealth: Payer: Self-pay | Admitting: *Deleted

## 2014-05-04 DIAGNOSIS — C50511 Malignant neoplasm of lower-outer quadrant of right female breast: Secondary | ICD-10-CM | POA: Insufficient documentation

## 2014-05-04 HISTORY — DX: Malignant neoplasm of lower-outer quadrant of right female breast: C50.511

## 2014-05-04 NOTE — Telephone Encounter (Signed)
Confirmed [facility] for 05/12/14 at 0800.  Instructions and contact information given.

## 2014-05-10 ENCOUNTER — Other Ambulatory Visit: Payer: BC Managed Care – PPO

## 2014-05-11 ENCOUNTER — Ambulatory Visit
Admission: RE | Admit: 2014-05-11 | Discharge: 2014-05-11 | Disposition: A | Discharge status: Home / Self Care | Payer: BC Managed Care – PPO | Source: Ambulatory Visit | Attending: Gynecology | Admitting: Gynecology

## 2014-05-11 DIAGNOSIS — C50911 Malignant neoplasm of unspecified site of right female breast: Secondary | ICD-10-CM

## 2014-05-11 MED ORDER — GADOBENATE DIMEGLUMINE 529 MG/ML IV SOLN
14.0000 mL | Freq: Once | INTRAVENOUS | Status: AC | PRN
  Administered 2014-05-11: 14 mL via INTRAVENOUS

## 2014-05-12 ENCOUNTER — Encounter: Payer: Self-pay | Admitting: Physical Therapy

## 2014-05-12 ENCOUNTER — Ambulatory Visit (HOSPITAL_BASED_OUTPATIENT_CLINIC_OR_DEPARTMENT_OTHER): Payer: BC Managed Care – PPO | Admitting: Hematology and Oncology

## 2014-05-12 ENCOUNTER — Other Ambulatory Visit (INDEPENDENT_AMBULATORY_CARE_PROVIDER_SITE_OTHER): Payer: Self-pay | Admitting: General Surgery

## 2014-05-12 ENCOUNTER — Ambulatory Visit
Admission: RE | Admit: 2014-05-12 | Discharge: 2014-05-12 | Disposition: A | Discharge status: Home / Self Care | Payer: BC Managed Care – PPO | Source: Ambulatory Visit | Attending: Radiation Oncology | Admitting: Radiation Oncology

## 2014-05-12 ENCOUNTER — Encounter: Payer: Self-pay | Admitting: Hematology and Oncology

## 2014-05-12 ENCOUNTER — Ambulatory Visit: Payer: BC Managed Care – PPO | Attending: General Surgery | Admitting: Physical Therapy

## 2014-05-12 ENCOUNTER — Ambulatory Visit: Payer: BC Managed Care – PPO

## 2014-05-12 ENCOUNTER — Encounter: Payer: Self-pay | Admitting: *Deleted

## 2014-05-12 ENCOUNTER — Other Ambulatory Visit (HOSPITAL_BASED_OUTPATIENT_CLINIC_OR_DEPARTMENT_OTHER): Payer: BC Managed Care – PPO

## 2014-05-12 ENCOUNTER — Encounter: Payer: Self-pay | Admitting: Skilled Nursing Facility1

## 2014-05-12 VITALS — BP 129/87 | HR 74 | Temp 97.6°F | Resp 18 | Ht 65.0 in | Wt 164.0 lb

## 2014-05-12 DIAGNOSIS — C50911 Malignant neoplasm of unspecified site of right female breast: Secondary | ICD-10-CM | POA: Insufficient documentation

## 2014-05-12 DIAGNOSIS — Z801 Family history of malignant neoplasm of trachea, bronchus and lung: Secondary | ICD-10-CM

## 2014-05-12 DIAGNOSIS — C50511 Malignant neoplasm of lower-outer quadrant of right female breast: Secondary | ICD-10-CM

## 2014-05-12 DIAGNOSIS — R293 Abnormal posture: Secondary | ICD-10-CM | POA: Insufficient documentation

## 2014-05-12 DIAGNOSIS — C50811 Malignant neoplasm of overlapping sites of right female breast: Secondary | ICD-10-CM

## 2014-05-12 DIAGNOSIS — Z170 Estrogen receptor positive status [ER+]: Secondary | ICD-10-CM

## 2014-05-12 LAB — CBC WITH DIFFERENTIAL/PLATELET
BASO%: 0.2 % (ref 0.0–2.0)
Basophils Absolute: 0 10*3/uL (ref 0.0–0.1)
EOS%: 2.5 % (ref 0.0–7.0)
Eosinophils Absolute: 0.1 10*3/uL (ref 0.0–0.5)
HEMATOCRIT: 41.7 % (ref 34.8–46.6)
HGB: 14.1 g/dL (ref 11.6–15.9)
LYMPH#: 1.6 10*3/uL (ref 0.9–3.3)
LYMPH%: 30 % (ref 14.0–49.7)
MCH: 31.8 pg (ref 25.1–34.0)
MCHC: 33.8 g/dL (ref 31.5–36.0)
MCV: 94.1 fL (ref 79.5–101.0)
MONO#: 0.4 10*3/uL (ref 0.1–0.9)
MONO%: 7.4 % (ref 0.0–14.0)
NEUT#: 3.1 10*3/uL (ref 1.5–6.5)
NEUT%: 59.9 % (ref 38.4–76.8)
Platelets: 268 10*3/uL (ref 145–400)
RBC: 4.43 10*6/uL (ref 3.70–5.45)
RDW: 13 % (ref 11.2–14.5)
WBC: 5.2 10*3/uL (ref 3.9–10.3)

## 2014-05-12 LAB — COMPREHENSIVE METABOLIC PANEL (CC13)
ALBUMIN: 3.8 g/dL (ref 3.5–5.0)
ALT: 12 U/L (ref 0–55)
AST: 12 U/L (ref 5–34)
Alkaline Phosphatase: 61 U/L (ref 40–150)
Anion Gap: 10 mEq/L (ref 3–11)
BUN: 12.7 mg/dL (ref 7.0–26.0)
CHLORIDE: 107 meq/L (ref 98–109)
CO2: 24 mEq/L (ref 22–29)
Calcium: 9.2 mg/dL (ref 8.4–10.4)
Creatinine: 0.7 mg/dL (ref 0.6–1.1)
EGFR: 87 mL/min/{1.73_m2} — ABNORMAL LOW (ref >90)
Glucose: 111 mg/dl (ref 70–140)
Potassium: 4.3 mEq/L (ref 3.5–5.1)
SODIUM: 141 meq/L (ref 136–145)
Total Bilirubin: 0.49 mg/dL (ref 0.20–1.20)
Total Protein: 7 g/dL (ref 6.4–8.3)

## 2014-05-12 NOTE — Patient Instructions (Signed)

## 2014-05-12 NOTE — Progress Notes (Signed)
New patient intake form reviewed with patient.  History/chart updated.  Sent to scan.

## 2014-05-12 NOTE — Progress Notes (Signed)
Clinical Social Work [place] Psychosocial Distress Screening [place]  Patient completed distress screening protocol and scored a 8 on the Psychosocial Distress Thermometer which indicates moderate distress. Clinical Social Worker met with patient and patients husband in [facility] to assess for distress and other psychosocial needs.  Patient stated that while she was feeling overwhelmed she felt comfortable in her treatment plan and treatment team.  CSW and patient discussed common feeling and emotions when being diagnosed with cancer.  CSW informed patient of the support team and support services at [facility], and patient was agreeable to an [employer] referral.  CSW provided contact information and encouraged patient to call with any questions or concerns.     ONCBCN DISTRESS SCREENING 05/12/2014  Screening Type Initial Screening  Distress experienced in past week (1-10) 8  Practical problem type Work/school  Family Problem type Other (comment)  Emotional problem type Adjusting to illness  Information Concerns Type Lack of info about diagnosis;Lack of info about treatment  Physical Problem type Sleep/insomnia  Physician notified of physical symptoms Yes  Referral to clinical psychology No  Referral to clinical social work Yes  Referral to dietition No  Referral to financial advocate No  Referral to support programs Yes  Referral to palliative care No

## 2014-05-12 NOTE — Progress Notes (Signed)
[facility] Radiation Oncology NEW PATIENT EVALUATION  Name: Tammy Day MRN: [id number]  Date:   05/12/2014           DOB: 04/04/1950  Status: outpatient   CC: MITCHELL,DEAN, MD  Toth, Paul III, MD    REFERRING PHYSICIAN: Toth, Paul III, MD   DIAGNOSIS: Stage IA (T1 N0 M0) invasive ductal carcinoma of the right breast   HISTORY OF PRESENT ILLNESS:  Tammy Day is a 64 y.o. female who is seen today through the courtesy of Dr. Toth at the [facility] for evaluation of her T1 N0 invasive ductal carcinoma the right breast.  At the time of a screening mammogram on 04/05/2014 she was felt to have a possible right breast mass.  Additional views on February 8 showed an irregular mass at 6 o'clock which on ultrasound measured 0.9 x 0.6 x 0.6 cm.  Ultrasound of the right axilla was negative.  On 04/30/2014 showed underwent a right breast core biopsy with the diagnosis of invasive ductal carcinoma, grade 2.  Her tumor was ER positive at 96, PR positive at 45 with an elevated Ki-67 of 49.  HER-2/neu negative.  MRI scan showed a solitary 0.9 cm right breast primary at 6:00.  She is without complaints today.  Seen today with Dr. Toth and Dr. Gudena.  PREVIOUS RADIATION THERAPY: No   PAST MEDICAL HISTORY:  has a past medical history of Pure hypercholesterolemia; IBS (irritable bowel syndrome); GERD (gastroesophageal reflux disease); Hiatal hernia; Ulcer; Ovarian cyst; Unspecified essential hypertension; Migraine; Breast cancer of lower-outer quadrant of right female breast (05/04/2014); Plantar fasciitis; Cardiac abnormality; and H/O oophorectomy.     PAST SURGICAL HISTORY:  Past Surgical History  Procedure Laterality Date  . Appendectomy    . Tubal ligation Bilateral 2009  . Abdominal hysterectomy  1992  . Breast cyst excision Right 1998  . Esophagogastroduodenoscopy  2002  . Nasal sinus surgery  2000, 2007  . Colonoscopy       FAMILY HISTORY: family history includes Lung  cancer in her maternal grandmother and mother; Stomach cancer in her father. Her father died from old age 81.  Her mother also died from old age at 95.  No family history of breast cancer.   SOCIAL HISTORY:  reports that she has never smoked. She has never used smokeless tobacco. She reports that she drinks about 8.4 oz of alcohol per week. She reports that she does not use illicit drugs.  Married, 2 children.  She is a high school education teacher.   ALLERGIES: Atorvastatin; Nexium; Other; Pravastatin; Prilosec; and Rabeprazole   MEDICATIONS:  Current Outpatient Prescriptions  Medication Sig Dispense Refill  . aspirin 81 MG tablet Take 81 mg by mouth daily.    . azelastine (ASTELIN) 0.1 % nasal spray Place into both nostrils 2 (two) times daily. Use in each nostril as directed    . Calcium-Phosphorus-Vitamin D (CALCIUM/D3 ADULT GUMMIES PO) Take by mouth.    . cholestyramine (QUESTRAN) 4 GM/DOSE powder Take by mouth daily.    . colesevelam (WELCHOL) 625 MG tablet Take 625 mg by mouth daily. 1 tab in the am, 2 in the pm.    . diclofenac (VOLTAREN) 75 MG EC tablet Take 1 tablet (75 mg total) by mouth 2 (two) times daily. 60 tablet 1  . loperamide (IMODIUM) 2 MG capsule Take by mouth as needed for diarrhea or loose stools.    . Multiple Vitamins-Minerals (ONE-A-DAY VITACRAVES PO) Take by mouth daily.    .   Omega-3 Fatty Acids (FISH OIL) 1200 MG CAPS Take by mouth daily.    . OVER THE COUNTER MEDICATION daily. Power c Immune Support Gummies    . RABEprazole (ACIPHEX) 20 MG tablet Take 20 mg by mouth every other day.    . telmisartan-hydrochlorothiazide (MICARDIS HCT) 80-25 MG per tablet Take by mouth daily. 1/2 tab daily    . triamcinolone cream (KENALOG) 0.1 % Apply 1 application topically 2 (two) times daily.     No current facility-administered medications for this encounter.     REVIEW OF SYSTEMS:  Pertinent items are noted in HPI.    PHYSICAL EXAM: Alert and oriented 64-year-old  white female appearing her stated age. Wt Readings from Last 3 Encounters:  05/12/14 164 lb (74.39 kg)  03/29/14 160 lb (72.576 kg)  06/03/13 162 lb (73.483 kg)   Temp Readings from Last 3 Encounters:  05/12/14 97.6 F (36.4 C) Oral   BP Readings from Last 3 Encounters:  05/12/14 129/87  03/29/14 124/82  06/03/13 109/79   Pulse Readings from Last 3 Encounters:  05/12/14 74  03/29/14 80   Head and neck examination: Grossly unremarkable.  Nodes: Without palpable cervical, supraclavicular, or axillary lymphadenopathy.  Chest: Lungs clear.  Breasts: There is a punctate biopsy wound along the lower outer quadrant of the right breast at approximately 8:00.  No masses are appreciated.  Left breast without masses or lesions.  Extremities: Without edema.    LABORATORY DATA:  Lab Results  Component Value Date   WBC 5.2 05/12/2014   HGB 14.1 05/12/2014   HCT 41.7 05/12/2014   MCV 94.1 05/12/2014   PLT 268 05/12/2014   Lab Results  Component Value Date   NA 141 05/12/2014   K 4.3 05/12/2014   CO2 24 05/12/2014   Lab Results  Component Value Date   ALT 12 05/12/2014   AST 12 05/12/2014   ALKPHOS 61 05/12/2014   BILITOT 0.49 05/12/2014      IMPRESSION: Clinical stage IA (T1 N0 M0) invasive ductal carcinoma of the right breast.  We discussed local management options which include partial mastectomy followed by radiation therapy or mastectomy along with a sentinel lymph node biopsy.  She is a candidate for hypofractionated radiation therapy.  We discussed the potential acute and late toxicities of radiation therapy.  Dr. Gudena will obtain Oncotype DX testing for decision regarding systemic therapy.  At this point in time she desires breast preservation.  Her prognosis appears to be favorable.   PLAN: As discussed above.  I can see her postoperatively for a follow-up visit.   I spent 30  minutes face to face with the patient and more than 50% of that time was spent in counseling  and/or coordination of care.          

## 2014-05-12 NOTE — Progress Notes (Signed)
Checked in new pt with no financial concerns prior to seeing the dr.  Informed pt if chemo is part of her treatment I will contact her ins to see if Josem Kaufmann is req and will obtain it if it is as well as contact foundations that offer copay assistance for chemo if needed.  She has my card for any billing questions or concerns.

## 2014-05-12 NOTE — Progress Notes (Signed)
Subjective:     Patient ID: Tammy Day, female   DOB: 1950/05/12, 64 y.o.   MRN: [id number]  HPI   Review of Systems     Objective:   Physical Exam For the patient to understand and be given the tools to implement a healthy plant based diet during their cancer diagnosis.     Assessment:     Patient was seen today and found to be reserved and accompanied by her seemingly supportive husband. Her husband has had head and neck cancer. Patients right breast is afflicted. Patients current/relevant medications: gummy vitamins, vitamin C, and omega 3 fatty acids. When asked why patient takes vitamin C patient states she takes it to calm herself down when everything with her husband is too stressful. Patient is hypertensive. Patients current labs are WNL. Patients weight 164 pounds, height 5'5'', BMI 27.3 and on 03/29/14 her weight was 160 pounds with a BMI of 26.7.         Plan:     Dietitian educated the patient on implementing a plant based diet by incorporating more plant proteins, fruits, and vegetables. As a part of a healthy routine physical activity was discussed. Dietitian educated the patient on vitamin C and the lack of FDA regulation in supplements.   The importance of legitimate, evidence based information was discussed and examples were given. A folder of evidence based information with a focus on a plant based diet and general nutrition during cancer was given to the patient.  As a part of the continuum of care the cancer dietitian's contact information was given to the patient in the event they would like to have a follow up appointment.

## 2014-05-12 NOTE — Progress Notes (Signed)
[place] NOTE  Patient Care Team: Donnie Coffin, MD as PCP - General (Family Medicine) Autumn Messing III, MD as Consulting Physician (General Surgery) Rulon Eisenmenger, MD as Consulting Physician (Hematology and Oncology) Rexene Edison, MD as Consulting Physician (Radiation Oncology) Roselee Culver, RN as Registered Nurse [facility] Caleen Jobs, RN as Registered Nurse  CHIEF COMPLAINTS/PURPOSE OF CONSULTATION:  Newly diagnosed breast cancer  HISTORY OF PRESENTING ILLNESS:  Tammy Day 64 y.o. female is here because of recent diagnosis of right breast cancer. Patient had a screening mammogram which revealed right breast abnormality which was evaluated by ultrasound and was found to be 9 mm at 6:00 position in the right breast. She underwent a biopsy that revealed a grade 2 invasive ductal carcinoma that was infiltrating the adipose tissue ER 96% PR 45% HER-2 negative ratio 1.03 with a Ki-67 of 49%. 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 She was presented at the multidisciplinary tumor board and she is here today at the [facility] clinic to discuss a treatment plan.  I reviewed her records extensively and collaborated the history with the patient.  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  She was never exposed to fertility medications or hormone replacement therapy.  She has no family history of Breast/GYn cancer Grandmother on mother's side had lung cancer and father had unknown cancer possibly in the stomach in the 2s  MEDICAL HISTORY:  Past Medical History  Diagnosis Date  . Pure hypercholesterolemia   . IBS (irritable bowel syndrome)   . GERD (gastroesophageal reflux disease)   . Hiatal hernia   . Ulcer   . Ovarian cyst   . Unspecified essential hypertension   . Migraine   . Breast cancer of lower-outer quadrant of right female breast 05/04/2014  . Plantar fasciitis   . Cardiac abnormality     lazy T wave on EKG  . H/O oophorectomy     SURGICAL HISTORY: Past Surgical History  Procedure Laterality Date  . Appendectomy    . Tubal ligation Bilateral 2009  . Abdominal hysterectomy  1992  . Breast cyst excision Right 1998  . Esophagogastroduodenoscopy  2002  . Nasal sinus surgery  2000, 2007  . Colonoscopy      SOCIAL HISTORY: History   Social History  . Marital Status: Married    Spouse Name: N/A  . Number of Children: N/A  . Years of Education: N/A   Occupational History  . Not on file.   Social History Main Topics  . Smoking status: Never Smoker   . Smokeless tobacco: Never Used  . Alcohol Use: 8.4 oz/week    14 Glasses of wine per week  . Drug  Use: No  . Sexual Activity: Yes   Other Topics Concern  . Not on file   Social History Narrative    FAMILY HISTORY: Family History  Problem Relation Age of Onset  . Stomach cancer Father   . Lung cancer Maternal Grandmother   . Lung cancer Mother     ALLERGIES:  is allergic to atorvastatin; nexium; other; pravastatin; prilosec; and rabeprazole.  MEDICATIONS:  Current Outpatient Prescriptions  Medication Sig Dispense Refill  . aspirin 81 MG tablet Take 81 mg by mouth daily.    Marland Kitchen azelastine (ASTELIN) 0.1 % nasal spray Place into both nostrils 2 (two) times  daily. Use in each nostril as directed    . Calcium-Phosphorus-Vitamin D (CALCIUM/D3 ADULT GUMMIES PO) Take by mouth.    . cholestyramine (QUESTRAN) 4 GM/DOSE powder Take by mouth daily.    . colesevelam (WELCHOL) 625 MG tablet Take 625 mg by mouth daily. 1 tab in the am, 2 in the pm.    . diclofenac (VOLTAREN) 75 MG EC tablet Take 1 tablet (75 mg total) by mouth 2 (two) times daily. 60 tablet 1  . loperamide (IMODIUM) 2 MG capsule Take by mouth as needed for diarrhea or loose stools.    . Multiple Vitamins-Minerals (ONE-A-DAY VITACRAVES PO) Take by mouth daily.    . Omega-3 Fatty Acids (FISH OIL) 1200 MG CAPS Take by mouth daily.    Marland Kitchen OVER THE COUNTER MEDICATION daily. Power c Immune Support Gummies    . RABEprazole (ACIPHEX) 20 MG tablet Take 20 mg by mouth every other day.    . telmisartan-hydrochlorothiazide (MICARDIS HCT) 80-25 MG per tablet Take by mouth daily. 1/2 tab daily    . triamcinolone cream (KENALOG) 0.1 % Apply 1 application topically 2 (two) times daily.     No current facility-administered medications for this visit.    REVIEW OF SYSTEMS:   Constitutional: Denies fevers, chills or abnormal night sweats Eyes: Denies blurriness of vision, double vision or watery eyes Ears, nose, mouth, throat, and face: Denies mucositis or sore throat Respiratory: Denies cough, dyspnea or wheezes Cardiovascular: Denies palpitation, chest discomfort or lower extremity swelling Gastrointestinal:  Denies nausea, heartburn or change in bowel habits Skin: Denies abnormal skin rashes Lymphatics: Denies new lymphadenopathy or easy bruising Neurological:Denies numbness, tingling or new weaknesses Behavioral/Psych: Mood is stable, no new changes  Breast:  Denies any palpable lumps or discharge All other systems were reviewed with the patient and are negative.  PHYSICAL EXAMINATION: ECOG PERFORMANCE STATUS: 0 - Asymptomatic  Filed Vitals:   05/12/14 0843  BP: 129/87  Pulse: 74  Temp: 97.6  F (36.4 C)  Resp: 18   Filed Weights   05/12/14 0843  Weight: 164 lb (74.39 kg)    GENERAL:alert, no distress and comfortable SKIN: skin color, texture, turgor are normal, no rashes or significant lesions EYES: normal, conjunctiva are pink and non-injected, sclera clear OROPHARYNX:no exudate, no erythema and lips, buccal mucosa, and tongue normal  NECK: supple, thyroid normal size, non-tender, without nodularity LYMPH:  no palpable lymphadenopathy in the cervical, axillary or inguinal LUNGS: clear to auscultation and percussion with normal breathing effort HEART: regular rate & rhythm and no murmurs and no lower extremity edema ABDOMEN:abdomen soft, non-tender and normal bowel sounds Musculoskeletal:no cyanosis of digits and no clubbing  PSYCH: alert & oriented x 3 with fluent speech NEURO: no focal motor/sensory deficits BREAST: Biopsy site changes in the right breast. No palpable axillary or supraclavicular lymphadenopathy (exam performed in the presence of  a chaperone)   LABORATORY DATA:  I have reviewed the data as listed Lab Results  Component Value Date   WBC 5.2 05/12/2014   HGB 14.1 05/12/2014   HCT 41.7 05/12/2014   MCV 94.1 05/12/2014   PLT 268 05/12/2014   Lab Results  Component Value Date   NA 141 05/12/2014   K 4.3 05/12/2014   CO2 24 05/12/2014    RADIOGRAPHIC STUDIES: I have personally reviewed the radiological reports and agreed with the findings in the report.  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 After reviewing the pathology in detail, we proceeded to discuss the different treatment options between surgery, radiation, chemotherapy, antiestrogen therapies.  Recommendation based on multidisciplinary tumor board: 1. Breast conserving surgery followed by 2. Oncotype DX testing of the final tumors greater than 5 mm to determine benefit of chemotherapy 3. Followed by radiation therapy and adjuvant antiestrogen therapy  Oncotype DX counseling: I discussed Oncotype DX test. I explained to the patient that this is a 21 gene panel to evaluate patient tumors DNA to calculate recurrence score. This would help determine whether patient has high risk or intermediate risk or low risk breast cancer. She understands that if her tumor was found to be high risk, she would benefit from systemic chemotherapy. If low risk, no need of chemotherapy. If she was found to be intermediate risk, we would need to evaluate the score as well as other risk factors and determine if an abbreviated chemotherapy may be of benefit.  Return to clinic after surgery to discuss adjuvant treatment options.  All questions were answered. The patient knows to call the clinic with any problems, questions or concerns.    Rulon Eisenmenger, MD 10:55 AM

## 2014-05-12 NOTE — Progress Notes (Signed)
Ms. Tammy Day is a very pleasant 64 y.o. female from [place], [place] with newly diagnosed grade 2 invasive ductal carcinoma of the right breast.  Biopsy results revealed the tumor's hormone status as ER positive, PR positive, and HER2/neu negative. Ki67 is 49%.  She presents today with her husband to the [facility]) for treatment consideration and recommendations from the breast surgeon, radiation oncologist, and medical oncologist.     I briefly met with Ms. Tammy Day and her husband during her [facility] visit today. We discussed the purpose of the Survivorship Clinic, which will include monitoring for recurrence, coordinating completion of age and gender-appropriate cancer screenings, promotion of overall wellness, as well as managing potential late/long-term side effects of anti-cancer treatments.    The treatment plan for Ms. Tammy Day will likely include surgery, radiation therapy, and anti-estrogen therapy.  As of today, the intent of treatment for Ms. Tammy Day is cure, therefore she will be eligible for the Survivorship Clinic upon her completion of treatment.  Her survivorship care plan (SCP) document will be drafted and updated throughout the course of her treatment trajectory. She will receive the SCP in an office visit with myself in the Survivorship Clinic once she has completed treatment.   Ms. Tammy Day was encouraged to ask questions and all questions were answered to her satisfaction.  She was given my business card and encouraged to contact me with any concerns regarding survivorship.  I look forward to participating in her care.   Mike Craze, NP [place] [phone]

## 2014-05-12 NOTE — Therapy (Signed)
[place], [place], [address] Phone: [phone]   Fax:  [phone]  Physical Therapy Evaluation  Patient Details  Name: Tammy Day MRN: [id number] Date of Birth: Aug 22, 1950 Referring Provider:  Jovita Kussmaul, MD  Encounter Date: 05/12/2014      PT End of Session - 05/12/14 1330    Visit Number 1   Number of Visits 1   PT Start Time 0936   PT Stop Time 1000   PT Time Calculation (min) 24 min   Behavior During Therapy [facility] for tasks assessed/performed      Past Medical History  Diagnosis Date  . Pure hypercholesterolemia   . IBS (irritable bowel syndrome)   . GERD (gastroesophageal reflux disease)   . Hiatal hernia   . Ulcer   . Ovarian cyst   . Unspecified essential hypertension   . Migraine   . Breast cancer of lower-outer quadrant of right female breast 05/04/2014  . Plantar fasciitis   . Cardiac abnormality     lazy T wave on EKG  . H/O oophorectomy     Past Surgical History  Procedure Laterality Date  . Appendectomy    . Tubal ligation Bilateral 2009  . Abdominal hysterectomy  1992  . Breast cyst excision Right 1998  . Esophagogastroduodenoscopy  2002  . Nasal sinus surgery  2000, 2007  . Colonoscopy      There were no vitals taken for this visit.  Visit Diagnosis:  Abnormal posture - Plan: PT plan of care cert/re-cert  Breast cancer, right - Plan: PT plan of care cert/re-cert      Subjective Assessment - 05/12/14 1314    Symptoms Paitent is here today for an assessment of her newly diagnosed right breast cancer.   Pertinent History Diagnosed 04/30/14 with right ER/PR positive, HER2 negative breast cancer with a Ki67 of 49%.  Mass is 9 mm in size.   Patient Stated Goals Learn post op shoulder ROM HEP and lymphedema risk reduction practices   Currently in Pain? No/denies   Multiple Pain Sites No          [facility] PT Assessment - 05/12/14 0001    Assessment    Medical Diagnosis Right breast cancer   Onset Date 04/30/14   Precautions   Precautions Other (comment)  Active cancer   Restrictions   Weight Bearing Restrictions No   Balance Screen   Has the patient fallen in the past 6 months No   Has the patient had a decrease in activity level because of a fear of falling?  No   Is the patient reluctant to leave their home because of a fear of falling?  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   Leisure Does yoga once a week   Cognition   Overall Cognitive Status Within Functional Limits for tasks assessed   Posture/Postural Control   Posture/Postural Control Postural limitations   Postural Limitations Rounded Shoulders;Forward head   ROM / Strength   AROM / PROM / Strength AROM;Strength   AROM   AROM Assessment Site Shoulder   Right/Left Shoulder Right;Left   Right Shoulder Extension 55 Degrees   Right Shoulder Flexion 155 Degrees   Right Shoulder ABduction 177 Degrees  Right Shoulder Internal Rotation 71 Degrees   Right Shoulder External Rotation 86 Degrees   Left Shoulder Extension 59 Degrees   Left Shoulder Flexion 157 Degrees   Left Shoulder ABduction 174 Degrees   Left Shoulder Internal Rotation 70 Degrees   Left Shoulder External Rotation 84 Degrees   Strength   Overall Strength Within functional limits for tasks performed           LYMPHEDEMA/ONCOLOGY QUESTIONNAIRE - 05/12/14 1327    Type   Cancer Type Right breast   Lymphedema Assessments   Lymphedema Assessments Upper extremities   Right Upper Extremity Lymphedema   10 cm Proximal to Olecranon Process 25.4 cm   Olecranon Process 22.5 cm   10 cm Proximal to Ulnar Styloid Process 19.8 cm   Just Proximal to Ulnar Styloid Process 14.6  cm   Across Hand at [employer] 17.9 cm   At [place] of 2nd Digit 6.4 cm   Left Upper Extremity Lymphedema   10 cm Proximal to Olecranon Process 26.7 cm   Olecranon Process 22.9 cm   10 cm Proximal to Ulnar Styloid Process 19.7 cm   Just Proximal to Ulnar Styloid Process 14.8 cm   Across Hand at [employer] 17.9 cm   At [place] of 2nd Digit 6.3 cm      Patient was instructed today in a home exercise program today for post op shoulder range of motion. These included active assist shoulder flexion in sitting, scapular retraction, wall walking with shoulder abduction, and hands behind head external rotation.  She was encouraged to do these twice a day, holding 3 seconds and repeating 5 times when permitted by her physician.         PT Education - 05/12/14 1329    Education provided Yes   Education Details Post op shoulder ROM HEP and lymphedema risk reduction   Person(s) Educated Patient;Spouse   Methods Explanation;Demonstration;Handout   Comprehension Verbalized understanding              Breast Clinic Goals - 05/12/14 1335    Patient will be able to verbalize understanding of pertinent lymphedema risk reduction practices relevant to her diagnosis specifically related to skin care.   Time 1   Period Days   Status Achieved   Patient will be able to return demonstrate and/or verbalize understanding of the post-op home exercise program related to regaining shoulder range of motion.   Time 1   Period Days   Status Achieved   Patient will be able to verbalize understanding of the importance of attending the postoperative After Breast Cancer Class for further lymphedema risk reduction education and therapeutic exercise.   Time 1   Period Days   Status Achieved              Plan - 05/12/14 1330    Clinical Impression Statement Patient was seen today for a baseline assessment of her newly diagnosed right breast cancer.  She is planning to have a right lumpectomy with  a sentinel node biopsy and Oncotype testing followed by radiation and anti-estrogen therapy.  She will benefit from post op PT to regain shoulder ROM and strength.   Pt will benefit from skilled therapeutic intervention in order to improve on the following deficits Decreased range of motion;Increased edema;Pain;Impaired UE functional use;Decreased strength;Decreased knowledge of precautions   Rehab Potential Excellent   Clinical Impairments Affecting Rehab Potential none   PT Frequency One time visit   PT Treatment/Interventions Patient/family education;Therapeutic  exercise   Consulted and Agree with Plan of Care Patient       Patient will follow up at outpatient cancer rehab if needed following surgery.  If the patient requires physical therapy at that time, a specific plan will be dictated and sent to the referring physician for approval. The patient was educated today on appropriate basic range of motion exercises to begin post operatively and the importance of attending the After Breast Cancer class following surgery.  Patient was educated today on lymphedema risk reduction practices as it pertains to recommendations that will benefit the patient immediately following surgery.  She verbalized good understanding.  No additional physical therapy is indicated at this time.      Problem List Patient Active Problem List   Diagnosis Date Noted  . Breast cancer of lower-outer quadrant of right female breast 05/04/2014  . Chest pain 05/19/2013  . Elevated lipids 05/19/2013  . HTN (hypertension) 05/19/2013  . Aortic root dilatation 05/19/2013    Lyndsie Wallman,MARTI COOPER, PT 05/12/2014, 1:38 PM  [place], [place], [address] Phone: [phone]   Fax:  [phone]

## 2014-05-12 NOTE — Assessment & Plan Note (Signed)
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 After reviewing the pathology in detail, we proceeded to discuss the different treatment options between surgery, radiation, chemotherapy, antiestrogen therapies.  Recommendation based on multidisciplinary tumor board: 1. Breast conserving surgery followed by 2. Oncotype DX testing of the final tumors greater than 5 mm to determine benefit of chemotherapy 3. Followed by radiation therapy and adjuvant antiestrogen therapy  Oncotype DX counseling: I discussed Oncotype DX test. I explained to the patient that this is a 21 gene panel to evaluate patient tumors DNA to calculate recurrence score. This would help determine whether patient has high risk or intermediate risk or low risk breast cancer. She understands that if her tumor was found to be high risk, she would benefit from systemic chemotherapy. If low risk, no need of chemotherapy. If she was found to be intermediate risk, we would need to evaluate the score as well as other risk factors and determine if an abbreviated chemotherapy may be of benefit.  Return to clinic after surgery to discuss adjuvant treatment options.

## 2014-05-18 ENCOUNTER — Other Ambulatory Visit (INDEPENDENT_AMBULATORY_CARE_PROVIDER_SITE_OTHER): Payer: Self-pay | Admitting: General Surgery

## 2014-05-18 ENCOUNTER — Telehealth: Payer: Self-pay | Admitting: *Deleted

## 2014-05-18 DIAGNOSIS — C50511 Malignant neoplasm of lower-outer quadrant of right female breast: Secondary | ICD-10-CM

## 2014-05-18 NOTE — Telephone Encounter (Signed)
Spoke to pt concerning [place] from 05/12/14. Pt denies questions or concerns regarding dx or treatment care plan. Confirmed surgery date for 06/24/14. Scheduled and confirmed f/u appt with Dr. Lindi Adie on 07/01/14 at 9:30AM. Encourage pt to call with needs. Received verbal understanding. Contact information given.

## 2014-05-19 ENCOUNTER — Other Ambulatory Visit (INDEPENDENT_AMBULATORY_CARE_PROVIDER_SITE_OTHER): Payer: Self-pay | Admitting: General Surgery

## 2014-05-20 ENCOUNTER — Other Ambulatory Visit (INDEPENDENT_AMBULATORY_CARE_PROVIDER_SITE_OTHER): Payer: Self-pay | Admitting: General Surgery

## 2014-05-20 DIAGNOSIS — C50511 Malignant neoplasm of lower-outer quadrant of right female breast: Secondary | ICD-10-CM

## 2014-06-18 ENCOUNTER — Encounter (HOSPITAL_BASED_OUTPATIENT_CLINIC_OR_DEPARTMENT_OTHER): Payer: Self-pay | Admitting: *Deleted

## 2014-06-18 NOTE — Progress Notes (Signed)
To come in for ekg after seeds

## 2014-06-22 ENCOUNTER — Encounter (HOSPITAL_BASED_OUTPATIENT_CLINIC_OR_DEPARTMENT_OTHER): Payer: Self-pay | Admitting: *Deleted

## 2014-06-23 ENCOUNTER — Ambulatory Visit
Admission: RE | Admit: 2014-06-23 | Discharge: 2014-06-23 | Disposition: A | Discharge status: Home / Self Care | Payer: BC Managed Care – PPO | Source: Ambulatory Visit | Attending: General Surgery | Admitting: General Surgery

## 2014-06-23 ENCOUNTER — Other Ambulatory Visit (INDEPENDENT_AMBULATORY_CARE_PROVIDER_SITE_OTHER): Payer: Self-pay | Admitting: General Surgery

## 2014-06-23 ENCOUNTER — Other Ambulatory Visit: Payer: Self-pay | Admitting: General Surgery

## 2014-06-23 ENCOUNTER — Encounter (HOSPITAL_BASED_OUTPATIENT_CLINIC_OR_DEPARTMENT_OTHER)
Admission: RE | Admit: 2014-06-23 | Discharge: 2014-06-23 | Disposition: A | Discharge status: Home / Self Care | Payer: BC Managed Care – PPO | Source: Ambulatory Visit

## 2014-06-23 DIAGNOSIS — C50511 Malignant neoplasm of lower-outer quadrant of right female breast: Secondary | ICD-10-CM | POA: Diagnosis not present

## 2014-06-23 DIAGNOSIS — C50911 Malignant neoplasm of unspecified site of right female breast: Secondary | ICD-10-CM

## 2014-06-23 DIAGNOSIS — K219 Gastro-esophageal reflux disease without esophagitis: Secondary | ICD-10-CM | POA: Diagnosis not present

## 2014-06-23 DIAGNOSIS — G43909 Migraine, unspecified, not intractable, without status migrainosus: Secondary | ICD-10-CM | POA: Diagnosis not present

## 2014-06-23 DIAGNOSIS — Z90710 Acquired absence of both cervix and uterus: Secondary | ICD-10-CM | POA: Diagnosis not present

## 2014-06-23 DIAGNOSIS — K649 Unspecified hemorrhoids: Secondary | ICD-10-CM | POA: Diagnosis not present

## 2014-06-23 DIAGNOSIS — E780 Pure hypercholesterolemia: Secondary | ICD-10-CM | POA: Diagnosis not present

## 2014-06-23 DIAGNOSIS — I10 Essential (primary) hypertension: Secondary | ICD-10-CM | POA: Diagnosis not present

## 2014-06-23 DIAGNOSIS — Z86010 Personal history of colonic polyps: Secondary | ICD-10-CM | POA: Diagnosis not present

## 2014-06-24 ENCOUNTER — Ambulatory Visit (HOSPITAL_BASED_OUTPATIENT_CLINIC_OR_DEPARTMENT_OTHER): Payer: BC Managed Care – PPO | Admitting: Anesthesiology

## 2014-06-24 ENCOUNTER — Encounter (HOSPITAL_BASED_OUTPATIENT_CLINIC_OR_DEPARTMENT_OTHER)
Admission: RE | Disposition: A | Discharge status: Home / Self Care | Payer: Self-pay | Source: Ambulatory Visit | Attending: General Surgery

## 2014-06-24 ENCOUNTER — Ambulatory Visit (HOSPITAL_BASED_OUTPATIENT_CLINIC_OR_DEPARTMENT_OTHER)
Admission: RE | Admit: 2014-06-24 | Discharge: 2014-06-24 | Disposition: A | Discharge status: Home / Self Care | Payer: BC Managed Care – PPO | Source: Ambulatory Visit | Attending: General Surgery | Admitting: General Surgery

## 2014-06-24 ENCOUNTER — Encounter (HOSPITAL_BASED_OUTPATIENT_CLINIC_OR_DEPARTMENT_OTHER): Payer: Self-pay | Admitting: *Deleted

## 2014-06-24 ENCOUNTER — Encounter (HOSPITAL_COMMUNITY)
Admission: RE | Admit: 2014-06-24 | Discharge: 2014-06-24 | Disposition: A | Discharge status: Home / Self Care | Payer: BC Managed Care – PPO | Source: Ambulatory Visit | Attending: General Surgery | Admitting: General Surgery

## 2014-06-24 ENCOUNTER — Ambulatory Visit
Admission: RE | Admit: 2014-06-24 | Discharge: 2014-06-24 | Disposition: A | Discharge status: Home / Self Care | Payer: BC Managed Care – PPO | Source: Ambulatory Visit | Attending: General Surgery | Admitting: General Surgery

## 2014-06-24 DIAGNOSIS — Z90710 Acquired absence of both cervix and uterus: Secondary | ICD-10-CM | POA: Insufficient documentation

## 2014-06-24 DIAGNOSIS — C50511 Malignant neoplasm of lower-outer quadrant of right female breast: Secondary | ICD-10-CM | POA: Insufficient documentation

## 2014-06-24 DIAGNOSIS — G43909 Migraine, unspecified, not intractable, without status migrainosus: Secondary | ICD-10-CM | POA: Insufficient documentation

## 2014-06-24 DIAGNOSIS — K649 Unspecified hemorrhoids: Secondary | ICD-10-CM | POA: Insufficient documentation

## 2014-06-24 DIAGNOSIS — Z86010 Personal history of colonic polyps: Secondary | ICD-10-CM | POA: Insufficient documentation

## 2014-06-24 DIAGNOSIS — K219 Gastro-esophageal reflux disease without esophagitis: Secondary | ICD-10-CM | POA: Insufficient documentation

## 2014-06-24 DIAGNOSIS — I10 Essential (primary) hypertension: Secondary | ICD-10-CM | POA: Insufficient documentation

## 2014-06-24 DIAGNOSIS — E780 Pure hypercholesterolemia: Secondary | ICD-10-CM | POA: Insufficient documentation

## 2014-06-24 HISTORY — PX: RADIOACTIVE SEED GUIDED PARTIAL MASTECTOMY WITH AXILLARY SENTINEL LYMPH NODE BIOPSY: SHX6520

## 2014-06-24 HISTORY — DX: Nasal congestion: R09.81

## 2014-06-24 HISTORY — PX: BREAST LUMPECTOMY: SHX2

## 2014-06-24 SURGERY — RADIOACTIVE SEED GUIDED PARTIAL MASTECTOMY WITH AXILLARY SENTINEL LYMPH NODE BIOPSY
Anesthesia: General | Site: Breast | Laterality: Right

## 2014-06-24 MED ORDER — FENTANYL CITRATE 0.05 MG/ML IJ SOLN
50.0000 ug | INTRAMUSCULAR | Status: DC | PRN
  Administered 2014-06-24 (×2): 25 ug via INTRAVENOUS
  Administered 2014-06-24: 100 ug via INTRAVENOUS

## 2014-06-24 MED ORDER — ONDANSETRON HCL 4 MG/2ML IJ SOLN
INTRAMUSCULAR | Status: DC | PRN
Start: 2014-06-24 — End: 2014-06-24
  Administered 2014-06-24: 4 mg via INTRAVENOUS

## 2014-06-24 MED ORDER — BUPIVACAINE HCL (PF) 0.25 % IJ SOLN
INTRAMUSCULAR | Status: DC | PRN
  Administered 2014-06-24: 20 mL

## 2014-06-24 MED ORDER — PROMETHAZINE HCL 25 MG/ML IJ SOLN: 6.2500 mg | INTRAMUSCULAR | Status: DC | PRN

## 2014-06-24 MED ORDER — FENTANYL CITRATE 0.05 MG/ML IJ SOLN
INTRAMUSCULAR | Status: AC
  Filled 2014-06-24: qty 2

## 2014-06-24 MED ORDER — HYDROMORPHONE HCL 1 MG/ML IJ SOLN
0.2500 mg | INTRAMUSCULAR | Status: DC | PRN
  Administered 2014-06-24 (×2): 0.5 mg via INTRAVENOUS

## 2014-06-24 MED ORDER — FENTANYL CITRATE 0.05 MG/ML IJ SOLN
INTRAMUSCULAR | Status: AC
  Filled 2014-06-24: qty 4

## 2014-06-24 MED ORDER — MIDAZOLAM HCL 2 MG/2ML IJ SOLN
INTRAMUSCULAR | Status: AC
  Filled 2014-06-24: qty 2

## 2014-06-24 MED ORDER — SCOPOLAMINE 1 MG/3DAYS TD PT72
MEDICATED_PATCH | TRANSDERMAL | Status: AC
  Filled 2014-06-24: qty 1

## 2014-06-24 MED ORDER — LACTATED RINGERS IV SOLN
INTRAVENOUS | Status: DC
  Administered 2014-06-24 (×2): via INTRAVENOUS

## 2014-06-24 MED ORDER — PHENYLEPHRINE HCL 10 MG/ML IJ SOLN
INTRAMUSCULAR | Status: DC | PRN
  Administered 2014-06-24: 40 ug via INTRAVENOUS

## 2014-06-24 MED ORDER — CEFAZOLIN SODIUM-DEXTROSE 2-3 GM-% IV SOLR
2.0000 g | INTRAVENOUS | Status: AC
  Administered 2014-06-24: 2 g via INTRAVENOUS

## 2014-06-24 MED ORDER — CEFAZOLIN SODIUM-DEXTROSE 2-3 GM-% IV SOLR
INTRAVENOUS | Status: AC
  Filled 2014-06-24: qty 50

## 2014-06-24 MED ORDER — PROPOFOL 10 MG/ML IV BOLUS
INTRAVENOUS | Status: DC | PRN
  Administered 2014-06-24: 170 mg via INTRAVENOUS

## 2014-06-24 MED ORDER — TECHNETIUM TC 99M SULFUR COLLOID FILTERED: 1.0000 | Freq: Once | INTRAVENOUS | Status: AC | PRN

## 2014-06-24 MED ORDER — OXYCODONE HCL 5 MG PO TABS: 5.0000 mg | ORAL_TABLET | Freq: Once | ORAL | Status: DC | PRN

## 2014-06-24 MED ORDER — EPHEDRINE SULFATE 50 MG/ML IJ SOLN
INTRAMUSCULAR | Status: DC | PRN
  Administered 2014-06-24 (×3): 10 mg via INTRAVENOUS

## 2014-06-24 MED ORDER — MIDAZOLAM HCL 2 MG/2ML IJ SOLN
1.0000 mg | INTRAMUSCULAR | Status: DC | PRN
  Administered 2014-06-24: 2 mg via INTRAVENOUS

## 2014-06-24 MED ORDER — DEXAMETHASONE SODIUM PHOSPHATE 4 MG/ML IJ SOLN
INTRAMUSCULAR | Status: DC | PRN
  Administered 2014-06-24: 10 mg via INTRAVENOUS

## 2014-06-24 MED ORDER — SCOPOLAMINE 1 MG/3DAYS TD PT72
1.0000 | MEDICATED_PATCH | TRANSDERMAL | Status: DC
  Administered 2014-06-24: 1.5 mg via TRANSDERMAL
  Administered 2014-06-24: 1 via TRANSDERMAL

## 2014-06-24 MED ORDER — OXYCODONE HCL 5 MG/5ML PO SOLN: 5.0000 mg | Freq: Once | ORAL | Status: DC | PRN

## 2014-06-24 MED ORDER — CHLORHEXIDINE GLUCONATE 4 % EX LIQD: 1.0000 "application " | Freq: Once | CUTANEOUS | Status: DC

## 2014-06-24 MED ORDER — HYDROMORPHONE HCL 1 MG/ML IJ SOLN
INTRAMUSCULAR | Status: AC
  Filled 2014-06-24: qty 1

## 2014-06-24 MED ORDER — ROPIVACAINE HCL 5 MG/ML IJ SOLN
INTRAMUSCULAR | Status: DC | PRN
  Administered 2014-06-24: 30 mg

## 2014-06-24 MED ORDER — OXYCODONE-ACETAMINOPHEN 5-325 MG PO TABS: 1.0000 | ORAL_TABLET | ORAL | Status: DC | PRN

## 2014-06-24 SURGICAL SUPPLY — 42 items
APPLIER CLIP 9.375 MED OPEN (MISCELLANEOUS) ×2
APR CLP MED 9.3 20 MLT OPN (MISCELLANEOUS) ×1
BLADE SURG 15 STRL LF DISP TIS (BLADE) ×1 IMPLANT
BLADE SURG 15 STRL SS (BLADE) ×2
CANISTER SUC SOCK COL 7IN (MISCELLANEOUS) IMPLANT
CANISTER SUCT 1200ML W/VALVE (MISCELLANEOUS) IMPLANT
CHLORAPREP W/TINT 26ML (MISCELLANEOUS) ×2 IMPLANT
CLIP APPLIE 9.375 MED OPEN (MISCELLANEOUS) ×1 IMPLANT
COVER BACK TABLE 60X90IN (DRAPES) ×2 IMPLANT
COVER MAYO STAND STRL (DRAPES) ×2 IMPLANT
COVER PROBE W GEL 5X96 (DRAPES) ×2 IMPLANT
DECANTER SPIKE VIAL GLASS SM (MISCELLANEOUS) IMPLANT
DEVICE DUBIN W/COMP PLATE 8390 (MISCELLANEOUS) ×2 IMPLANT
DRAPE LAPAROSCOPIC ABDOMINAL (DRAPES) ×2 IMPLANT
DRAPE UTILITY XL STRL (DRAPES) ×2 IMPLANT
ELECT COATED BLADE 2.86 ST (ELECTRODE) ×2 IMPLANT
ELECT REM PT RETURN 9FT ADLT (ELECTROSURGICAL) ×2
ELECTRODE REM PT RTRN 9FT ADLT (ELECTROSURGICAL) ×1 IMPLANT
GLOVE BIO SURGEON STRL SZ7.5 (GLOVE) ×2 IMPLANT
GLOVE BIOGEL PI IND STRL 7.5 (GLOVE) ×1 IMPLANT
GLOVE BIOGEL PI INDICATOR 7.5 (GLOVE) ×1
GLOVE SURG SS PI 7.5 STRL IVOR (GLOVE) ×2 IMPLANT
GOWN STRL REUS W/ TWL LRG LVL3 (GOWN DISPOSABLE) ×2 IMPLANT
GOWN STRL REUS W/TWL LRG LVL3 (GOWN DISPOSABLE) ×2
KIT MARKER MARGIN INK (KITS) ×2 IMPLANT
LIQUID BAND (GAUZE/BANDAGES/DRESSINGS) ×4 IMPLANT
NDL SAFETY ECLIPSE 18X1.5 (NEEDLE) IMPLANT
NEEDLE HYPO 18GX1.5 SHARP (NEEDLE)
NEEDLE HYPO 25X1 1.5 SAFETY (NEEDLE) ×2 IMPLANT
NS IRRIG 1000ML POUR BTL (IV SOLUTION) ×2 IMPLANT
PACK BASIN DAY SURGERY FS (CUSTOM PROCEDURE TRAY) ×2 IMPLANT
PENCIL BUTTON HOLSTER BLD 10FT (ELECTRODE) ×2 IMPLANT
SLEEVE SCD COMPRESS KNEE MED (MISCELLANEOUS) ×2 IMPLANT
SPONGE LAP 18X18 X RAY DECT (DISPOSABLE) ×2 IMPLANT
SUT MON AB 4-0 PC3 18 (SUTURE) ×4 IMPLANT
SUT SILK 2 0 SH (SUTURE) IMPLANT
SUT VICRYL 3-0 CR8 SH (SUTURE) ×2 IMPLANT
SYR CONTROL 10ML LL (SYRINGE) ×2 IMPLANT
TOWEL OR 17X24 6PK STRL BLUE (TOWEL DISPOSABLE) ×2 IMPLANT
TOWEL OR NON WOVEN STRL DISP B (DISPOSABLE) ×2 IMPLANT
TUBE CONNECTING 20X1/4 (TUBING) ×2 IMPLANT
YANKAUER SUCT BULB TIP NO VENT (SUCTIONS) ×2 IMPLANT

## 2014-06-24 NOTE — Transfer of Care (Signed)
Immediate Anesthesia Transfer of Care Note  Patient: Tammy Day  Procedure(s) Performed: Procedure(s): RADIOACTIVE SEED GUIDED PARTIAL MASTECTOMY WITH AXILLARY SENTINEL LYMPH NODE BIOPSY (Right)  Patient Location: PACU  Anesthesia Type:GA combined with regional for post-op pain  Level of Consciousness: sedated  Airway & Oxygen Therapy: Patient Spontanous Breathing and Patient connected to face mask oxygen  Post-op Assessment: Report given to RN and Post -op Vital signs reviewed and stable  Post vital signs: Reviewed and stable  Last Vitals:  Filed Vitals:   06/24/14 0826  BP:   Pulse: 66  Temp:   Resp: 13    Complications: No apparent anesthesia complications

## 2014-06-24 NOTE — Discharge Instructions (Signed)
°  Post Anesthesia Home Care Instructions ° °Activity: °Get plenty of rest for the remainder of the day. A responsible adult should stay with you for 24 hours following the procedure.  °For the next 24 hours, DO NOT: °-Drive a car °-Operate machinery °-Drink alcoholic beverages °-Take any medication unless instructed by your physician °-Make any legal decisions or sign important papers. ° °Meals: °Start with liquid foods such as gelatin or soup. Progress to regular foods as tolerated. Avoid greasy, spicy, heavy foods. If nausea and/or vomiting occur, drink only clear liquids until the nausea and/or vomiting subsides. Call your physician if vomiting continues. ° °Special Instructions/Symptoms: °Your throat may feel dry or sore from the anesthesia or the breathing tube placed in your throat during surgery. If this causes discomfort, gargle with warm salt water. The discomfort should disappear within 24 hours. ° °If you had a scopolamine patch placed behind your ear for the management of post- operative nausea and/or vomiting: ° °1. The medication in the patch is effective for 72 hours, after which it should be removed.  Wrap patch in a tissue and discard in the trash. Wash hands thoroughly with soap and water. °2. You may remove the patch earlier than 72 hours if you experience unpleasant side effects which may include dry mouth, dizziness or visual disturbances. °3. Avoid touching the patch. Wash your hands with soap and water after contact with the patch. °  °Regional Anesthesia Blocks ° °1. Numbness or the inability to move the "blocked" extremity may last from 3-48 hours after placement. The length of time depends on the medication injected and your individual response to the medication. If the numbness is not going away after 48 hours, call your surgeon. ° °2. The extremity that is blocked will need to be protected until the numbness is gone and the  Strength has returned. Because you cannot feel it, you will need  to take extra care to avoid injury. Because it may be weak, you may have difficulty moving it or using it. You may not know what position it is in without looking at it while the block is in effect. ° °3. For blocks in the legs and feet, returning to weight bearing and walking needs to be done carefully. You will need to wait until the numbness is entirely gone and the strength has returned. You should be able to move your leg and foot normally before you try and bear weight or walk. You will need someone to be with you when you first try to ensure you do not fall and possibly risk injury. ° °4. Bruising and tenderness at the needle site are common side effects and will resolve in a few days. ° °5. Persistent numbness or new problems with movement should be communicated to the surgeon or the [facility] ([phone])/ [facility] ([phone]). °

## 2014-06-24 NOTE — Anesthesia Preprocedure Evaluation (Signed)
Anesthesia Evaluation  Patient identified by MRN, date of birth, ID band Patient awake    Reviewed: Allergy & Precautions, NPO status , Patient's Chart, lab work & pertinent test results  Airway Mallampati: II  TM Distance: >3 FB     Dental  (+) Dental Advisory Given, Teeth Intact   Pulmonary neg pulmonary ROS,    Pulmonary exam normal       Cardiovascular hypertension, + Peripheral Vascular Disease     Neuro/Psych  Headaches,  Neuromuscular disease negative psych ROS   GI/Hepatic Neg liver ROS, hiatal hernia, GERD-  ,  Endo/Other    Renal/GU negative Renal ROS     Musculoskeletal   Abdominal   Peds  Hematology negative hematology ROS (+)   Anesthesia Other Findings   Reproductive/Obstetrics                             Anesthesia Physical Anesthesia Plan  ASA: II  Anesthesia Plan: General   Post-op Pain Management:    Induction: Intravenous  Airway Management Planned: LMA  Additional Equipment:   Intra-op Plan:   Post-operative Plan: Extubation in OR  Informed Consent: I have reviewed the patients History and Physical, chart, labs and discussed the procedure including the risks, benefits and alternatives for the proposed anesthesia with the patient or authorized representative who has indicated his/her understanding and acceptance.   Dental advisory given  Plan Discussed with: CRNA, Anesthesiologist and Surgeon  Anesthesia Plan Comments:         Anesthesia Quick Evaluation

## 2014-06-24 NOTE — Progress Notes (Signed)
Assisted Dr. Singer with right, ultrasound guided, pectoralis block. Side rails up, monitors on throughout procedure. See vital signs in flow sheet. Tolerated Procedure well. °

## 2014-06-24 NOTE — Anesthesia Procedure Notes (Signed)
Anesthesia Regional Block:  Pectoralis block  Pre-Anesthetic Checklist: ,, timeout performed, Correct Patient, Correct Site, Correct Laterality, Correct Procedure, Correct Position, site marked, Risks and benefits discussed,  Surgical consent,  Pre-op evaluation,  At surgeon's request and post-op pain management  Laterality: Right  Prep: chloraprep       Needles:  Injection technique: Single-shot  Needle Type: Echogenic Stimulator Needle     Needle Length: 10cm 10 cm Needle Gauge: 21 and 21 G    Additional Needles:  Procedures: ultrasound guided (picture in chart) Pectoralis block Narrative:  Start time: 06/24/2014 7:59 AM End time: 06/24/2014 8:09 AM Injection made incrementally with aspirations every 5 mL.  Performed by: Personally

## 2014-06-24 NOTE — Op Note (Signed)
06/24/2014  9:53 AM  PATIENT:  Tammy Day  64 y.o. female  PRE-OPERATIVE DIAGNOSIS:  Right Breast Cancer  POST-OPERATIVE DIAGNOSIS:  Right Breast Cancer  PROCEDURE:  Procedure(s): RADIOACTIVE SEED GUIDED PARTIAL MASTECTOMY WITH AXILLARY SENTINEL LYMPH NODE BIOPSY (Right)  SURGEON:  Surgeon(s) and Role:    * Jovita Kussmaul, MD - Primary  PHYSICIAN ASSISTANT:   ASSISTANTS: none   ANESTHESIA:   general  EBL:  Total I/O In: 1000 [I.V.:1000] Out: -   BLOOD ADMINISTERED:none  DRAINS: none   LOCAL MEDICATIONS USED:  MARCAINE     SPECIMEN:  Source of Specimen:  right breast tissue and sentinel node  DISPOSITION OF SPECIMEN:  PATHOLOGY  COUNTS:  YES  TOURNIQUET:  * No tourniquets in log *  DICTATION: .Dragon Dictation  After informed consent was obtained the patient was brought to the operating room and placed in the supine position on the operating room table. After adequate induction of general anesthesia the patient's right chest, breast, and axillary area were prepped with ChloraPrep, allowed to dry, and draped in usual sterile manner. Earlier in the day the patient underwent injection of 1 mCi of technetium sulfur colloid in the subareolar position on the right. Previously the patient also had an I-125 radioactive seed placed in the lower aspect of the right breast to mark an area of cancer. The neoprobe device was first dissected to technetium and used to identify a hot spot in the right axilla. A small transverse incision was made with a 15 blade knife overlying the hot spot. This incision was carried through the skin and subcutaneous tissue sharply with electrocautery until the axilla was entered. A [place] retractor was deployed. Using the neoprobe we were able to identify by blunt hemostat dissection a hot lymph node. The lymph node was excised sharply with the electrocautery and the lymphatics were controlled with clips. Ex vivo counts on this node were  approximately 200. There were no other hot or palpable lymph nodes in the right axilla. This was sent to pathology as sentinel node #1. The axilla was infiltrated with quarter percent [place]. The wound was examined and found to be hemostatic. The deep layer of the wound was closed with interrupted 3-0 Vicryl stitches. The skin was then closed with a running 4-0 Monocryl subcuticular stitch. Attention was then turned to the right breast. The area of radioactivity was very inferior in the 6:00 position near the inframammary fold. Because of its location and inframammary incision was made with a 15 blade knife. The dissection was carried through the skin and subcutaneous tissue until the chest wall was encountered. Dissection was carried superiorly along the chest wall until we were beyond the area of radioactivity. The position of the radioactivity was checked frequently and the breast tissue around the area of radioactivity was excised sharply with the electrocautery until the area was completely removed. The specimen was then marked with the appropriate paint colors. A specimen radiograph was obtained that showed the clip and seeds to be in the center of the specimen. There was no residual radioactivity in the breast for iodine 125. The specimen was then sent to pathology for further evaluation. The wound was infiltrated with quarter percent Marcaine. The deep layer of the wound was closed with interrupted 3-0 Vicryl stitches. The skin was then closed with interrupted 4-0 Monocryl subcuticular stitches. Dermabond dressings were applied. The patient tolerated the procedure well. At the end of the case all needle sponge and instrument counts were  correct. The patient was then awakened and taken to recovery in stable condition.  PLAN OF CARE: Discharge to home after PACU  PATIENT DISPOSITION:  PACU - hemodynamically stable.   Delay start of Pharmacological VTE agent (>24hrs) due to surgical blood loss or risk  of bleeding: not applicable

## 2014-06-24 NOTE — Interval H&P Note (Signed)
History and Physical Interval Note:  06/24/2014 8:17 AM  Tammy Day  has presented today for surgery, with the diagnosis of Right Breast Cancer  The various methods of treatment have been discussed with the patient and family. After consideration of risks, benefits and other options for treatment, the patient has consented to  Procedure(s): RADIOACTIVE SEED GUIDED PARTIAL MASTECTOMY WITH AXILLARY SENTINEL LYMPH NODE BIOPSY (Right) as a surgical intervention .  The patient's history has been reviewed, patient examined, no change in status, stable for surgery.  I have reviewed the patient's chart and labs.  Questions were answered to the patient's satisfaction.     TOTH III,Jahaziel Francois S

## 2014-06-24 NOTE — Anesthesia Postprocedure Evaluation (Signed)
Anesthesia Post Note  Patient: Tammy Day  Procedure(s) Performed: Procedure(s) (LRB): RADIOACTIVE SEED GUIDED PARTIAL MASTECTOMY WITH AXILLARY SENTINEL LYMPH NODE BIOPSY (Right)  Anesthesia type: general  Patient location: PACU  Post pain: Pain level controlled  Post assessment: Patient's Cardiovascular Status Stable  Last Vitals:  Filed Vitals:   06/24/14 1100  BP: 104/61  Pulse: 90  Temp: 36.4 C  Resp: 18    Post vital signs: Reviewed and stable  Level of consciousness: sedated  Complications: No apparent anesthesia complications

## 2014-06-24 NOTE — H&P (Signed)
Tammy Day 05/12/2014 8:14 AM Location: [facility] Patient #: [id number] DOB: 19-Oct-1950 Undefined / Language: Tammy Day / Race: Undefined Female  History of Present Illness Tammy Dibbles S. Marlou Starks MD; 05/12/2014 11:32 AM) The patient is a 64 year old female who presents with breast cancer. We're asked to see the patient in consultation by Dr. Kalman Day to evaluate her for a right sided breast cancer. The patient is a 64 year old white female who recently went for a routine screening mammogram. At that time she was found to have a mass in the 6:00 position of the right breast. This was biopsied and came back as invasive breast cancer. It measured 9 mm by ultrasound and MRI. She was ER and PR positive and HER-2 negative with Ki-67 of 49%. She denies any breast pain or discharge from the nipple. She has had benign breast cysts in the past. She has no real family history of breast cancer.   Other Problems Tammy Day, Day; 05/12/2014 8:14 AM) Breast Cancer Gastric Ulcer Gastroesophageal Reflux Disease Heart murmur Hemorrhoids High blood pressure Hypercholesterolemia Lump In Breast Migraine Headache Oophorectomy Bilateral. Other disease, cancer, significant illness Transfusion history  Past Surgical History Tammy Day, Day; 05/12/2014 8:14 AM) Appendectomy Breast Biopsy Right. Colon Polyp Removal - Colonoscopy Hysterectomy (not due to cancer) - Complete Oral Surgery  Diagnostic Studies History Tammy Day, Day; 05/12/2014 8:14 AM) Colonoscopy 1-5 years ago Mammogram within last year Pap Smear 1-5 years ago  Social History Tammy Day, Day; 05/12/2014 8:14 AM) Alcohol use Moderate alcohol use. No caffeine use No drug use Tobacco use Never smoker.  Family History Tammy Day, Day; 05/12/2014 8:14 AM) Alcohol Abuse Family Members In General. Cancer Father. Colon Polyps Brother, Father. Diabetes Mellitus Family Members In General,  Father. Heart Disease Father. Hypertension Father. Ischemic Bowel Disease Father. Melanoma Father. Respiratory Condition Family Members In General.  Pregnancy / Birth History Tammy Day, Day; 05/12/2014 8:14 AM) [age] of [age] Contraceptive History Oral contraceptives. Gravida 4 Irregular periods Maternal age 41-30 Para 2  Review of Systems Tammy Day; 05/12/2014 8:14 AM) General Not Present- Appetite Loss, Chills, Fatigue, Fever, Night Sweats, Weight Gain and Weight Loss. Skin Not Present- Change in Wart/Mole, Dryness, Hives, Jaundice, New Lesions, Non-Healing Wounds, Rash and Ulcer. HEENT Present- Wears glasses/contact lenses. Not Present- Earache, Hearing Loss, Hoarseness, Nose Bleed, Oral Ulcers, Ringing in the Ears, Seasonal Allergies, Sinus Pain, Sore Throat, Visual Disturbances and Yellow Eyes. Respiratory Present- Snoring. Not Present- Bloody sputum, Chronic Cough, Difficulty Breathing and Wheezing. Breast Not Present- Breast Mass, Breast Pain, Nipple Discharge and Skin Changes. Cardiovascular Present- Leg Cramps. Not Present- Chest Pain, Difficulty Breathing Lying Down, Palpitations, Rapid Heart Rate, Shortness of Breath and Swelling of Extremities. Gastrointestinal Not Present- Abdominal Pain, Bloating, Bloody Stool, Change in Bowel Habits, Chronic diarrhea, Constipation, Difficulty Swallowing, Excessive gas, Gets full quickly at meals, Hemorrhoids, Indigestion, Nausea, Rectal Pain and Vomiting. Female Genitourinary Not Present- Frequency, Nocturia, Painful Urination, Pelvic Pain and Urgency. Musculoskeletal Not Present- Back Pain, Joint Pain, Joint Stiffness, Muscle Pain, Muscle Weakness and Swelling of Extremities. Neurological Not Present- Decreased Memory, Fainting, Headaches, Numbness, Seizures, Tingling, Tremor, Trouble walking and Weakness. Psychiatric Present- Anxiety and Fearful. Not Present- Bipolar, Change in Sleep Pattern, Depression and  Frequent crying. Endocrine Not Present- Cold Intolerance, Excessive Hunger, Hair Changes, Heat Intolerance, Hot flashes and New Diabetes. Hematology Present- Easy Bruising. Not Present- Excessive bleeding, Gland problems, HIV and Persistent Infections.   Physical Exam Tammy Dibbles S. Marlou Starks MD; 05/12/2014 11:33 AM)  General Mental Status-Alert. General Appearance-Consistent with stated age. Hydration-Well hydrated. Voice-Normal.  Head and Neck Head-normocephalic, atraumatic with no lesions or palpable masses. Trachea-midline. Thyroid Gland Characteristics - normal size and consistency.  Eye Eyeball - Bilateral-Extraocular movements intact. Sclera/Conjunctiva - Bilateral-No scleral icterus.  Chest and Lung Exam Chest and lung exam reveals -quiet, even and easy respiratory effort with no use of accessory muscles and on auscultation, normal breath sounds, no adventitious sounds and normal vocal resonance. Inspection Chest Wall - Normal. Back - normal.  Breast Note: There is a small palpable mass in the 6:00 position of the right breast which could be some bruising. There is no palpable mass in the left breast. There is no palpable axillary, supraclavicular, or cervical lymphadenopathy   Cardiovascular Cardiovascular examination reveals -normal heart sounds, regular rate and rhythm with no murmurs and normal pedal pulses bilaterally.  Abdomen Inspection Inspection of the abdomen reveals - No Hernias. Skin - Scar - no surgical scars. Palpation/Percussion Palpation and Percussion of the abdomen reveal - Soft, Non Tender, No Rebound tenderness, No Rigidity (guarding) and No hepatosplenomegaly. Auscultation Auscultation of the abdomen reveals - Bowel sounds normal.  Neurologic Neurologic evaluation reveals -alert and oriented x 3 with no impairment of recent or remote memory. Mental Status-Normal.  Musculoskeletal Normal Exam - Left-Upper Extremity Strength  Normal and Lower Extremity Strength Normal. Normal Exam - Right-Upper Extremity Strength Normal and Lower Extremity Strength Normal.  Lymphatic Head & Neck  General Head & Neck Lymphatics: Bilateral - Description - Normal. Axillary  General Axillary Region: Bilateral - Description - Normal. Tenderness - Non Tender. Femoral & Inguinal  Generalized Femoral & Inguinal Lymphatics: Bilateral - Description - Normal. Tenderness - Non Tender.    Assessment & Plan Tammy Dibbles S. Marlou Starks MD; 05/12/2014 11:34 AM) PRIMARY CANCER OF LOWER OUTER QUADRANT OF RIGHT FEMALE BREAST (174.5  C50.511) Impression: The patient appears to have a small stage I cancer in the 6:00 position of the right breast. I have talked her into detail about the different options for treatment. At this point she favors breast conservation and I think this is a reasonable way to treat her cancer. Clinically her nodes are negative. She will require a right breast radioactive seed localized lumpectomy and sentinel node mapping. I've discussed with her in detail the risks and benefits of the operation to do this as well as some of the technical aspects and she understands and wishes to proceed     Signed by Luella Cook, MD (05/12/2014 11:35 AM)

## 2014-06-24 NOTE — Progress Notes (Signed)
Emotional support during breast injections °

## 2014-06-25 ENCOUNTER — Encounter (HOSPITAL_BASED_OUTPATIENT_CLINIC_OR_DEPARTMENT_OTHER): Payer: Self-pay | Admitting: General Surgery

## 2014-06-30 NOTE — Assessment & Plan Note (Signed)
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  Recommendation 1. ONCOTYPE DX testing to see if she benefits from chemo 2. Followed by XRT followed by 3. Anti-estrogen therapy.  I will call the patient with the result and set up further appointments.

## 2014-07-01 ENCOUNTER — Telehealth: Payer: Self-pay | Admitting: *Deleted

## 2014-07-01 ENCOUNTER — Encounter: Payer: Self-pay | Admitting: *Deleted

## 2014-07-01 ENCOUNTER — Ambulatory Visit (HOSPITAL_BASED_OUTPATIENT_CLINIC_OR_DEPARTMENT_OTHER): Payer: BC Managed Care – PPO | Admitting: Hematology and Oncology

## 2014-07-01 VITALS — BP 116/69 | HR 82 | Temp 97.8°F | Resp 18 | Ht 65.0 in | Wt 162.9 lb

## 2014-07-01 DIAGNOSIS — C50511 Malignant neoplasm of lower-outer quadrant of right female breast: Secondary | ICD-10-CM | POA: Diagnosis not present

## 2014-07-01 DIAGNOSIS — Z170 Estrogen receptor positive status [ER+]: Secondary | ICD-10-CM | POA: Diagnosis not present

## 2014-07-01 NOTE — Progress Notes (Signed)
Met with pt post op during Dr. Lindi Adie visit. Relate she is doing well. Only complaint is that her "stiches itch". Informed pt that was a normal healing symptom. Denies further needs or concerns. Encourage pt to call with questions.

## 2014-07-01 NOTE — Telephone Encounter (Signed)
Received order for oncotype testing. Requisition sent to pathology. PAC sent to [facility].

## 2014-07-01 NOTE — Progress Notes (Signed)
Patient Care Team: L.Donnie Coffin, MD as PCP - General (Family Medicine) Autumn Messing III, MD as Consulting Physician (General Surgery) Nicholas Lose, MD as Consulting Physician (Hematology and Oncology) Arloa Koh, MD as Consulting Physician (Radiation Oncology) Rockwell Germany, RN as Registered Nurse Mauro Kaufmann, RN as Registered Nurse Holley Bouche, NP as Nurse Practitioner (Nurse Practitioner)  DIAGNOSIS: Breast cancer of lower-outer quadrant of right female breast   Staging form: Breast, AJCC 7th Edition     Clinical stage from 05/12/2014: Stage IA (T1b, N0, M0) - Unsigned       Staging comments: Staged at breast conference 3.2.16    SUMMARY OF ONCOLOGIC HISTORY:   Breast cancer of lower-outer quadrant of right female breast   04/30/2014 Initial Diagnosis Right breast biopsy 6:00: Invasive mammary cancer favor ductal, grade 2, ER 96%, PR 45%, Ki-67 49%, HER-2 negative ratio 1.03   05/11/2014 Breast MRI Right breast 6:00 position: 0.9 x 0.7 x 0.6 cm mass, right breast 1 cm T2 hyperintense enhancing mass suggestive of benign fibroadenoma which has been stable   06/24/2014 Surgery IDC Grade 2, 0.9 cm, 3 SLN Neg T1bN0 (Stage 1A) with IG DCIS    CHIEF COMPLIANT: Follow-up after surgery  INTERVAL HISTORY: Tammy Day is a 64 year old lady with above-mentioned history of right-sided breast cancer underwent lumpectomy and is here today to discuss the surgical results. It showed 0.9 cm tumor with the 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. She has previously PR positive and HER-2 negative disease. Her Ki-67 was 49%. She is healing well with a partial some itching discomfort at the surgical site.  REVIEW OF SYSTEMS:   Constitutional: Denies fevers, chills or abnormal weight loss Eyes: Denies blurriness of vision Ears, nose, mouth, throat, and face: Denies mucositis or sore throat Respiratory: Denies cough, dyspnea or wheezes Cardiovascular: Denies palpitation, chest discomfort or lower  extremity swelling Gastrointestinal:  Denies nausea, heartburn or change in bowel habits Skin: Denies abnormal skin rashes Lymphatics: Denies new lymphadenopathy or easy bruising Neurological:Denies numbness, tingling or new weaknesses Behavioral/Psych: Mood is stable, no new changes  Breast: Itching and discomfort from surgical sites Day other systems were reviewed with the patient and are negative.  I have reviewed the past medical history, past surgical history, social history and family history with the patient and they are unchanged from previous note.  ALLERGIES:  is allergic to atorvastatin; nexium; other; pravastatin; prilosec; and rabeprazole.  MEDICATIONS:  Current Outpatient Prescriptions  Medication Sig Dispense Refill  . Ascorbic Acid 125 MG CHEW Chew by mouth.    Marland Kitchen aspirin 81 MG tablet Take 81 mg by mouth daily.    Marland Kitchen azelastine (ASTELIN) 0.1 % nasal spray Place into both nostrils 2 (two) times daily. Use in each nostril as directed    . Calcium-Phosphorus-Vitamin D (CALCIUM/D3 ADULT GUMMIES PO) Take by mouth.    . cholestyramine (QUESTRAN) 4 GM/DOSE powder Take by mouth daily.    . colesevelam (WELCHOL) 625 MG tablet Take 625 mg by mouth daily. 1 tab in the am, 2 in the pm.    . loperamide (IMODIUM) 2 MG capsule Take by mouth as needed for diarrhea or loose stools.    . montelukast (SINGULAIR) 10 MG tablet Take 10 mg by mouth at bedtime.    . Multiple Vitamins-Minerals (ONE-A-DAY VITACRAVES PO) Take by mouth daily.    . Omega-3 Fatty Acids (FISH OIL) 1200 MG CAPS Take by mouth daily.    Marland Kitchen OVER THE COUNTER MEDICATION daily. Power c Immune  Support Gummies    . oxyCODONE-acetaminophen (ROXICET) 5-325 MG per tablet Take 1-2 tablets by mouth every 4 (four) hours as needed. 50 tablet 0  . telmisartan-hydrochlorothiazide (MICARDIS HCT) 80-25 MG per tablet Take by mouth daily. 1/2 tab daily    . RABEprazole (ACIPHEX) 20 MG tablet Take 20 mg by mouth every other day.    .  triamcinolone cream (KENALOG) 0.1 % Apply 1 application topically 2 (two) times daily.     No current facility-administered medications for this visit.    PHYSICAL EXAMINATION: ECOG PERFORMANCE STATUS: 1 - Symptomatic but completely ambulatory  Filed Vitals:   07/01/14 0918  BP: 116/69  Pulse: 82  Temp: 97.8 F (36.6 C)  Resp: 18   Filed Weights   07/01/14 0918  Weight: 162 lb 14.4 oz (73.891 kg)    GENERAL:alert, no distress and comfortable SKIN: skin color, texture, turgor are normal, no rashes or significant lesions EYES: normal, Conjunctiva are pink and non-injected, sclera clear OROPHARYNX:no exudate, no erythema and lips, buccal mucosa, and tongue normal  NECK: supple, thyroid normal size, non-tender, without nodularity LYMPH:  no palpable lymphadenopathy in the cervical, axillary or inguinal LUNGS: clear to auscultation and percussion with normal breathing effort HEART: regular rate & rhythm and no murmurs and no lower extremity edema ABDOMEN:abdomen soft, non-tender and normal bowel sounds Musculoskeletal:no cyanosis of digits and no clubbing  NEURO: alert & oriented x 3 with fluent speech, no focal motor/sensory deficits  LABORATORY DATA:  I have reviewed the data as listed   Chemistry      Component Value Date/Time   NA 141 05/12/2014 0837   K 4.3 05/12/2014 0837   CO2 24 05/12/2014 0837   BUN 12.7 05/12/2014 0837   CREATININE 0.7 05/12/2014 0837      Component Value Date/Time   CALCIUM 9.2 05/12/2014 0837   ALKPHOS 61 05/12/2014 0837   AST 12 05/12/2014 0837   ALT 12 05/12/2014 0837   BILITOT 0.49 05/12/2014 0837       Lab Results  Component Value Date   WBC 5.2 05/12/2014   HGB 14.1 05/12/2014   HCT 41.7 05/12/2014   MCV 94.1 05/12/2014   PLT 268 05/12/2014   NEUTROABS 3.1 05/12/2014    ASSESSMENT & PLAN:  Breast cancer of lower-outer quadrant of right female breast Right breast invasive ductal carcinoma detected through screening 9 mm at  6:00 position by ultrasound 9 mm by MRI, second mass on MRI felt to be a stable fibroadenoma, grade 2 invasive ductal carcinoma ER 96%, PR 45%, HER-2 negative ratio 1.03, Ki-67 is 49% S/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  Recommendation 1. ONCOTYPE DX testing to see if she benefits from chemo 2. Followed by XRT followed by 3. Anti-estrogen therapy.  I will call the patient with the result and set up further appointments.   No orders of the defined types were placed in this encounter.   The patient has a good understanding of the overall plan. she agrees with it. She will call with any problems that may develop before her next visit here.   Rulon Eisenmenger, MD

## 2014-07-07 ENCOUNTER — Ambulatory Visit: Payer: BC Managed Care – PPO | Admitting: Radiation Oncology

## 2014-07-07 ENCOUNTER — Ambulatory Visit: Payer: BC Managed Care – PPO

## 2014-07-08 ENCOUNTER — Encounter: Payer: Self-pay | Admitting: *Deleted

## 2014-07-08 NOTE — Progress Notes (Signed)
Received Oncotype Dx score of 28.  Placed a copy in Dr. Geralyn Flash box and took one to HIM to scan.

## 2014-07-09 ENCOUNTER — Telehealth: Payer: Self-pay | Admitting: Hematology and Oncology

## 2014-07-09 ENCOUNTER — Encounter: Payer: Self-pay | Admitting: *Deleted

## 2014-07-09 NOTE — Telephone Encounter (Signed)
Left message on home vm for patient re appointment 5/3 @ 4:15pm. Called cell and spoke with patient re appointment.

## 2014-07-13 ENCOUNTER — Telehealth: Payer: Self-pay | Admitting: Hematology and Oncology

## 2014-07-13 ENCOUNTER — Ambulatory Visit
Admission: RE | Admit: 2014-07-13 | Discharge: 2014-07-13 | Disposition: A | Discharge status: Home / Self Care | Payer: BC Managed Care – PPO | Source: Ambulatory Visit | Attending: Radiation Oncology | Admitting: Radiation Oncology

## 2014-07-13 ENCOUNTER — Ambulatory Visit (HOSPITAL_BASED_OUTPATIENT_CLINIC_OR_DEPARTMENT_OTHER): Payer: BC Managed Care – PPO | Admitting: Hematology and Oncology

## 2014-07-13 ENCOUNTER — Encounter: Payer: Self-pay | Admitting: Radiation Oncology

## 2014-07-13 VITALS — BP 122/76 | HR 75 | Temp 97.9°F | Ht 65.0 in | Wt 164.0 lb

## 2014-07-13 DIAGNOSIS — C50811 Malignant neoplasm of overlapping sites of right female breast: Secondary | ICD-10-CM | POA: Diagnosis not present

## 2014-07-13 DIAGNOSIS — C50511 Malignant neoplasm of lower-outer quadrant of right female breast: Secondary | ICD-10-CM

## 2014-07-13 MED ORDER — ONDANSETRON HCL 8 MG PO TABS: 8.0000 mg | ORAL_TABLET | Freq: Two times a day (BID) | ORAL | Status: DC

## 2014-07-13 MED ORDER — LORAZEPAM 0.5 MG PO TABS: 0.5000 mg | ORAL_TABLET | Freq: Every day | ORAL | Status: DC

## 2014-07-13 MED ORDER — DEXAMETHASONE 4 MG PO TABS: 4.0000 mg | ORAL_TABLET | Freq: Two times a day (BID) | ORAL | Status: DC

## 2014-07-13 MED ORDER — PROCHLORPERAZINE MALEATE 10 MG PO TABS: 10.0000 mg | ORAL_TABLET | Freq: Four times a day (QID) | ORAL | Status: DC | PRN

## 2014-07-13 NOTE — Telephone Encounter (Signed)
Gave avs & calendar for May. Sent message to schedule treatment. °

## 2014-07-13 NOTE — Addendum Note (Signed)
Encounter addended by: Benn Moulder, RN on: 07/13/2014  7:43 PM<BR>     Documentation filed: Charges VN, Flowsheet VN

## 2014-07-13 NOTE — Progress Notes (Signed)
CC: Dr. Autumn Messing III, Dr. Nicholas Lose, Dr. 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 Tammy Day visits today for review and discussion of radiation therapy following conservative surgery in the management of her pathologic stage T1b N0 invasive ductal/DCIS of the right breast.  I first saw the patient at the multidisciplinary clinic on 05/12/2014. At the time of a screening mammogram on 04/05/2014 she was felt to have a possible right breast mass. Additional views on February 8 showed an irregular mass at 6 o'clock which on ultrasound measured 0.9 x 0.6 x 0.6 cm. Ultrasound of the right axilla was negative. On 04/30/2014 showed underwent a right breast core biopsy with the diagnosis of invasive ductal carcinoma, grade 2. Her tumor was ER positive at 96, PR positive at 45 with an elevated Ki-67 of 49. HER-2/neu negative. MRI scan showed a solitary 0.9 cm right breast primary at 6:00.She underwent a right partial mastectomy and sentinel lymph node biopsy by Dr. Marlou Starks on 06/24/2014.  She was found have a 0.9 cm invasive ductal carcinoma, grade 2 along with DCIS, intermediate grade with calcifications.  Surgical margins were negative with margins greater than 0.2 cm to all margins for both invasive and in situ disease.  3 lymph nodes were free of metastatic disease.  She is doing well postoperatively although Dr. Marlou Starks did aspirate her right axillary seroma.  Dr. Lindi Adie ordered Oncotype DX testing and her recurrence score is 28, indicating approximately a 5% improvement in survival with addition of chemotherapy to antiestrogen therapy.  She will see Dr. Lindi Adie this afternoon of for discussion of adjuvant chemotherapy.  Physical examination: Alert and oriented. Filed Vitals:   07/13/14 1107  BP: 122/76  Pulse: 75  Temp: 97.9 F (36.6 C)   Nodes: There is no palpable cervical, supraclavicular, or axillary lymphadenopathy.   There is a small seroma along the right axilla deep to her sentinel lymph node biopsy wound.  Breasts: There is a lower partial mastectomy wound near the right inframammary fold extending from 5:00 to 7:00.  The wound is healing well.  No masses are appreciated.  Extremities: Without edema.  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  Her surgical margins are more than adequate, and she is a candidate for breast preservation with radiation therapy.  She does not present with calcifications, therefore there is no need for a mammogram.  We discussed the potential acute and late toxicities of radiation therapy.  Consent is signed today.  She will meet with Dr. Lindi Adie this afternoon to discuss adjuvant chemotherapy.  There appears to be a 5% survival improvement with the addition of chemotherapy, the patient indicates that she would be interested in pursuing chemotherapy.  In this case, I would like to see her back for a follow-up visit near completion of chemotherapy.  She would require standard fractionation.  I do not feel that she needs a boost.  Plan: As above.  We will finalize her treatment plan after she sees Dr. Lindi Adie later today.  30 minutes was spent face-to-face with the patient, primarily counseling patient and coordinating her care.

## 2014-07-13 NOTE — Progress Notes (Addendum)
Tammy Day states he is here today to ascertain what will happen next in regards to treatment.  She has level 3 pain in the axilla with mild numbness.

## 2014-07-13 NOTE — Assessment & Plan Note (Addendum)
Right breast invasive ductal carcinoma, grade 2, 0.9 cm, 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  Oncotype DX counseling: I discussed Oncotype result and counseled her extensively that even though her tumor was only 0.9 cm, she has intermittent risk of recurrence and hence I would recommend systemic chemotherapy.  Treatment plan: Taxotere Cytoxan every 3 weeks 4 cycles  Chemotherapy counseling: I discussed risks and benefits of chemotherapy including the risk of nausea vomiting, hair loss, risk of infection, fatigue, loss of taste, taste changes, Neulasta related bone pain, long-standing bone marrow suppression. Patient understands these risks and has consented to proceed with treatment.  Plan: 1. Chemotherapy counseling 2. Start chemotherapy 07/20/2014   3. Consult her extensively on antiemetic regimen and provided her prescriptions  Return to clinic 1 week after starting chemotherapy for toxicity check. Patient plans to take time off for chemotherapy. 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

## 2014-07-13 NOTE — Progress Notes (Signed)
Patient Care Team: L.Donnie Coffin, MD as PCP - General (Family Medicine) Autumn Messing III, MD as Consulting Physician (General Surgery) Nicholas Lose, MD as Consulting Physician (Hematology and Oncology) Arloa Koh, MD as Consulting Physician (Radiation Oncology) Rockwell Germany, RN as Registered Nurse Mauro Kaufmann, RN as Registered Nurse Holley Bouche, NP as Nurse Practitioner (Nurse Practitioner)  DIAGNOSIS: Breast cancer of lower-outer quadrant of right female breast   Staging form: Breast, AJCC 7th Edition     Clinical stage from 05/12/2014: Stage IA (T1b, N0, M0) - Unsigned       Staging comments: Staged at breast conference 3.2.16    SUMMARY OF ONCOLOGIC HISTORY:   Breast cancer of lower-outer quadrant of right female breast   04/30/2014 Initial Diagnosis Right breast biopsy 6:00: Invasive mammary cancer favor ductal, grade 2, ER 96%, PR 45%, Ki-67 49%, HER-2 negative ratio 1.03   05/11/2014 Breast MRI Right breast 6:00 position: 0.9 x 0.7 x 0.6 cm mass, right breast 1 cm T2 hyperintense enhancing mass suggestive of benign fibroadenoma which has been stable   06/24/2014 Surgery IDC Grade 2, 0.9 cm, 3 SLN Neg T1bN0 (Stage 1A) with IG DCIS, Oncotype DX recurrence score 28, 18% risk of recurrence    CHIEF COMPLIANT: Follow-up after surgery discuss Oncotype DX  INTERVAL HISTORY: Tammy Day is a 64 year old lady with above-mentioned history of right-sided breast cancer underwent lumpectomy and had a 0.9 cm tumor but it had intermediate risk Oncotype DX score of 28. She is here today to discuss Oncotype results as well as to plan further treatment. She works in the high school as a Pharmacist, hospital.  REVIEW OF SYSTEMS:   Constitutional: Denies fevers, chills or abnormal weight loss Eyes: Denies blurriness of vision Ears, nose, mouth, throat, and face: Denies mucositis or sore throat Respiratory: Denies cough, dyspnea or wheezes Cardiovascular: Denies palpitation, chest discomfort or  lower extremity swelling Gastrointestinal:  Denies nausea, heartburn or change in bowel habits Skin: Denies abnormal skin rashes Lymphatics: Denies new lymphadenopathy or easy bruising Neurological:Denies numbness, tingling or new weaknesses Behavioral/Psych: Mood is stable, no new changes   All other systems were reviewed with the patient and are negative.  I have reviewed the past medical history, past surgical history, social history and family history with the patient and they are unchanged from previous note.  ALLERGIES:  is allergic to atorvastatin; nexium; other; pravastatin; prilosec; and rabeprazole.  MEDICATIONS:  Current Outpatient Prescriptions  Medication Sig Dispense Refill  . Ascorbic Acid 125 MG CHEW Chew by mouth.    Marland Kitchen aspirin 81 MG tablet Take 81 mg by mouth daily.    Marland Kitchen azelastine (ASTELIN) 0.1 % nasal spray Place into both nostrils 2 (two) times daily. Use in each nostril as directed    . Calcium-Phosphorus-Vitamin D (CALCIUM/D3 ADULT GUMMIES PO) Take by mouth.    . cholestyramine (QUESTRAN) 4 GM/DOSE powder Take by mouth daily.    . colesevelam (WELCHOL) 625 MG tablet Take 625 mg by mouth daily. 1 tab in the am, 2 in the pm.    . loperamide (IMODIUM) 2 MG capsule Take by mouth as needed for diarrhea or loose stools.    . montelukast (SINGULAIR) 10 MG tablet Take 10 mg by mouth at bedtime.    . Multiple Vitamins-Minerals (ONE-A-DAY VITACRAVES PO) Take by mouth daily.    . Omega-3 Fatty Acids (FISH OIL) 1200 MG CAPS Take by mouth daily.    Marland Kitchen oxyCODONE-acetaminophen (ROXICET) 5-325 MG per tablet Take 1-2 tablets  by mouth every 4 (four) hours as needed. 50 tablet 0  . RABEprazole (ACIPHEX) 20 MG tablet Take 20 mg by mouth as needed.     Marland Kitchen telmisartan-hydrochlorothiazide (MICARDIS HCT) 80-25 MG per tablet Take by mouth daily. 1/2 tab daily    . triamcinolone cream (KENALOG) 0.1 % Apply 1 application topically 2 (two) times daily. As needed    . dexamethasone (DECADRON) 4  MG tablet Take 1 tablet (4 mg total) by mouth 2 (two) times daily. Start the day before Taxotere. Then again the day after chemo for 3 days. 30 tablet 1  . LORazepam (ATIVAN) 0.5 MG tablet Take 1 tablet (0.5 mg total) by mouth at bedtime. 30 tablet 0  . ondansetron (ZOFRAN) 8 MG tablet Take 1 tablet (8 mg total) by mouth 2 (two) times daily. Start the day after chemo for 3 days. Then take as needed for nausea or vomiting. 30 tablet 1  . prochlorperazine (COMPAZINE) 10 MG tablet Take 1 tablet (10 mg total) by mouth every 6 (six) hours as needed (Nausea or vomiting). 30 tablet 1   No current facility-administered medications for this visit.    PHYSICAL EXAMINATION: ECOG PERFORMANCE STATUS: 0 - Asymptomatic  There were no vitals filed for this visit. There were no vitals filed for this visit.  GENERAL:alert, no distress and comfortable SKIN: skin color, texture, turgor are normal, no rashes or significant lesions EYES: normal, Conjunctiva are pink and non-injected, sclera clear OROPHARYNX:no exudate, no erythema and lips, buccal mucosa, and tongue normal  NECK: supple, thyroid normal size, non-tender, without nodularity LYMPH:  no palpable lymphadenopathy in the cervical, axillary or inguinal LUNGS: clear to auscultation and percussion with normal breathing effort HEART: regular rate & rhythm and no murmurs and no lower extremity edema ABDOMEN:abdomen soft, non-tender and normal bowel sounds Musculoskeletal:no cyanosis of digits and no clubbing  NEURO: alert & oriented x 3 with fluent speech, no focal motor/sensory deficits  LABORATORY DATA:  I have reviewed the data as listed   Chemistry      Component Value Date/Time   NA 141 05/12/2014 0837   K 4.3 05/12/2014 0837   CO2 24 05/12/2014 0837   BUN 12.7 05/12/2014 0837   CREATININE 0.7 05/12/2014 0837      Component Value Date/Time   CALCIUM 9.2 05/12/2014 0837   ALKPHOS 61 05/12/2014 0837   AST 12 05/12/2014 0837   ALT 12  05/12/2014 0837   BILITOT 0.49 05/12/2014 0837       Lab Results  Component Value Date   WBC 5.2 05/12/2014   HGB 14.1 05/12/2014   HCT 41.7 05/12/2014   MCV 94.1 05/12/2014   PLT 268 05/12/2014   NEUTROABS 3.1 05/12/2014    ASSESSMENT & PLAN:  Breast cancer of lower-outer quadrant of right female breast Right breast invasive ductal carcinoma, grade 2, 0.9 cm, 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  Oncotype DX counseling: I discussed Oncotype result and counseled her extensively that even though her tumor was only 0.9 cm, she has intermittent risk of recurrence and hence I would recommend systemic chemotherapy.  Treatment plan: Taxotere Cytoxan every 3 weeks 4 cycles  Chemotherapy counseling: I discussed risks and benefits of chemotherapy including the risk of nausea vomiting, hair loss, risk of infection, fatigue, loss of taste, taste changes, Neulasta related bone pain, long-standing bone marrow suppression. Patient understands these  risks and has consented to proceed with treatment.  Plan: 1. Chemotherapy counseling 2. Start chemotherapy 07/20/2014   3. Consult her extensively on antiemetic regimen and provided her prescriptions  Return to clinic 1 week after starting chemotherapy for toxicity check. Patient plans to take time off for chemotherapy. 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       Orders Placed This Encounter  Procedures  . CBC with Differential    Standing Status: Standing     Number of Occurrences: 20     Standing Expiration Date: 07/14/2015  . Comprehensive metabolic panel    Standing Status: Standing     Number of Occurrences: 20     Standing Expiration Date: 07/14/2015   The patient has a good understanding of the overall plan. she agrees with it. she will call with any  problems that may develop before the next visit here.   Rulon Eisenmenger, MD

## 2014-07-14 ENCOUNTER — Telehealth: Payer: Self-pay | Admitting: Hematology and Oncology

## 2014-07-15 ENCOUNTER — Other Ambulatory Visit: Payer: BC Managed Care – PPO

## 2014-07-15 ENCOUNTER — Encounter: Payer: Self-pay | Admitting: *Deleted

## 2014-07-19 ENCOUNTER — Other Ambulatory Visit: Payer: Self-pay | Admitting: *Deleted

## 2014-07-19 MED ORDER — UNABLE TO FIND: 1.0000 | Status: DC | PRN

## 2014-07-20 ENCOUNTER — Ambulatory Visit (HOSPITAL_BASED_OUTPATIENT_CLINIC_OR_DEPARTMENT_OTHER): Payer: BC Managed Care – PPO

## 2014-07-20 ENCOUNTER — Encounter: Payer: Self-pay | Admitting: *Deleted

## 2014-07-20 ENCOUNTER — Other Ambulatory Visit (HOSPITAL_BASED_OUTPATIENT_CLINIC_OR_DEPARTMENT_OTHER): Payer: BC Managed Care – PPO

## 2014-07-20 ENCOUNTER — Ambulatory Visit: Payer: BC Managed Care – PPO

## 2014-07-20 ENCOUNTER — Other Ambulatory Visit: Payer: Self-pay | Admitting: *Deleted

## 2014-07-20 VITALS — BP 121/73 | HR 67 | Temp 97.6°F | Resp 16

## 2014-07-20 DIAGNOSIS — C50811 Malignant neoplasm of overlapping sites of right female breast: Secondary | ICD-10-CM

## 2014-07-20 DIAGNOSIS — Z5111 Encounter for antineoplastic chemotherapy: Secondary | ICD-10-CM

## 2014-07-20 DIAGNOSIS — C50511 Malignant neoplasm of lower-outer quadrant of right female breast: Secondary | ICD-10-CM

## 2014-07-20 LAB — CBC WITH DIFFERENTIAL/PLATELET
BASO%: 0.1 % (ref 0.0–2.0)
Basophils Absolute: 0 10*3/uL (ref 0.0–0.1)
EOS ABS: 0 10*3/uL (ref 0.0–0.5)
EOS%: 0 % (ref 0.0–7.0)
HCT: 39.3 % (ref 34.8–46.6)
HGB: 13.2 g/dL (ref 11.6–15.9)
LYMPH%: 8.4 % — AB (ref 14.0–49.7)
MCH: 31 pg (ref 25.1–34.0)
MCHC: 33.6 g/dL (ref 31.5–36.0)
MCV: 92.2 fL (ref 79.5–101.0)
MONO#: 0.3 10*3/uL (ref 0.1–0.9)
MONO%: 2.6 % (ref 0.0–14.0)
NEUT#: 9.9 10*3/uL — ABNORMAL HIGH (ref 1.5–6.5)
NEUT%: 88.9 % — ABNORMAL HIGH (ref 38.4–76.8)
PLATELETS: 295 10*3/uL (ref 145–400)
RBC: 4.27 10*6/uL (ref 3.70–5.45)
RDW: 13.3 % (ref 11.2–14.5)
WBC: 11.1 10*3/uL — ABNORMAL HIGH (ref 3.9–10.3)
lymph#: 0.9 10*3/uL (ref 0.9–3.3)

## 2014-07-20 LAB — COMPREHENSIVE METABOLIC PANEL (CC13)
ALBUMIN: 3.8 g/dL (ref 3.5–5.0)
ALT: 19 U/L (ref 0–55)
ANION GAP: 14 meq/L — AB (ref 3–11)
AST: 9 U/L (ref 5–34)
Alkaline Phosphatase: 64 U/L (ref 40–150)
BUN: 16.5 mg/dL (ref 7.0–26.0)
CALCIUM: 9.4 mg/dL (ref 8.4–10.4)
CHLORIDE: 109 meq/L (ref 98–109)
CO2: 18 mEq/L — ABNORMAL LOW (ref 22–29)
Creatinine: 0.8 mg/dL (ref 0.6–1.1)
EGFR: 82 mL/min/{1.73_m2} — AB (ref >90)
Glucose: 227 mg/dl — ABNORMAL HIGH (ref 70–140)
POTASSIUM: 4.2 meq/L (ref 3.5–5.1)
SODIUM: 141 meq/L (ref 136–145)
TOTAL PROTEIN: 7 g/dL (ref 6.4–8.3)
Total Bilirubin: 0.37 mg/dL (ref 0.20–1.20)

## 2014-07-20 MED ORDER — SODIUM CHLORIDE 0.9 % IV SOLN
Freq: Once | INTRAVENOUS | Status: AC
  Administered 2014-07-20: 10:00:00 via INTRAVENOUS

## 2014-07-20 MED ORDER — SODIUM CHLORIDE 0.9 % IV SOLN
Freq: Once | INTRAVENOUS | Status: AC
  Administered 2014-07-20: 10:00:00 via INTRAVENOUS
  Filled 2014-07-20: qty 1

## 2014-07-20 MED ORDER — SODIUM CHLORIDE 0.9 % IV SOLN
600.0000 mg/m2 | Freq: Once | INTRAVENOUS | Status: AC
  Administered 2014-07-20: 1120 mg via INTRAVENOUS
  Filled 2014-07-20: qty 56

## 2014-07-20 MED ORDER — DOCETAXEL CHEMO INJECTION 160 MG/16ML
75.0000 mg/m2 | Freq: Once | INTRAVENOUS | Status: AC
  Administered 2014-07-20: 140 mg via INTRAVENOUS
  Filled 2014-07-20: qty 14

## 2014-07-20 MED ORDER — PALONOSETRON HCL INJECTION 0.25 MG/5ML
0.2500 mg | Freq: Once | INTRAVENOUS | Status: AC
  Administered 2014-07-20: 0.25 mg via INTRAVENOUS

## 2014-07-20 MED ORDER — PEGFILGRASTIM 6 MG/0.6ML ~~LOC~~ PSKT
6.0000 mg | PREFILLED_SYRINGE | Freq: Once | SUBCUTANEOUS | Status: AC
  Administered 2014-07-20: 6 mg via SUBCUTANEOUS
  Filled 2014-07-20: qty 0.6

## 2014-07-20 MED ORDER — UNABLE TO FIND: 1.0000 | Freq: Once | Status: DC

## 2014-07-20 MED ORDER — PALONOSETRON HCL INJECTION 0.25 MG/5ML
INTRAVENOUS | Status: AC
  Filled 2014-07-20: qty 5

## 2014-07-20 NOTE — Patient Instructions (Signed)
[facility] Discharge Instructions for Patients Receiving Chemotherapy  Today you received the following chemotherapy agents Taxotere, Cytoxan  To help prevent nausea and vomiting after your treatment, we encourage you to take your nausea medication    If you develop nausea and vomiting that is not controlled by your nausea medication, call the clinic.   BELOW ARE SYMPTOMS THAT SHOULD BE REPORTED IMMEDIATELY:  *FEVER GREATER THAN 100.5 F  *CHILLS WITH OR WITHOUT FEVER  NAUSEA AND VOMITING THAT IS NOT CONTROLLED WITH YOUR NAUSEA MEDICATION  *UNUSUAL SHORTNESS OF BREATH  *UNUSUAL BRUISING OR BLEEDING  TENDERNESS IN MOUTH AND THROAT WITH OR WITHOUT PRESENCE OF ULCERS  *URINARY PROBLEMS  *BOWEL PROBLEMS  UNUSUAL RASH Items with * indicate a potential emergency and should be followed up as soon as possible.  Feel free to call the clinic you have any questions or concerns. The clinic phone number is [phone].  Please show the CHEMO ALERT CARD at check-in to the Emergency Department and triage nurse.   

## 2014-07-20 NOTE — Progress Notes (Signed)
Spoke with patient today at her 1st chemotherapy treatment.  She is doing well. She requested a wig voucher, prescription and another journey.  She states she has misplaced it.  Provided the patient with those materials and encouraged her to call with any needs or concerns.

## 2014-07-21 ENCOUNTER — Telehealth: Payer: Self-pay | Admitting: *Deleted

## 2014-07-21 NOTE — Telephone Encounter (Signed)
Patient received 1st TC yesterday. Patient denies any nausea, vomiting or diarrhea. States that she is eating and drinking well. Patient advised to call for any questions or concerns. Patient verbalized understanding.

## 2014-07-26 NOTE — Assessment & Plan Note (Signed)
Right breast invasive ductal carcinoma, grade 2, 0.9 cm, 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  Treatment plan: Taxotere Cytoxan every 3 weeks 4 cycles Today is cycle 1 day 8  Chemo toxicities:  Labs were reviewed Monitoring closely for toxicities  RTC in 2 weeks for cycle 2

## 2014-07-27 ENCOUNTER — Ambulatory Visit (HOSPITAL_BASED_OUTPATIENT_CLINIC_OR_DEPARTMENT_OTHER): Payer: BC Managed Care – PPO | Admitting: Hematology and Oncology

## 2014-07-27 ENCOUNTER — Other Ambulatory Visit (HOSPITAL_BASED_OUTPATIENT_CLINIC_OR_DEPARTMENT_OTHER): Payer: BC Managed Care – PPO

## 2014-07-27 ENCOUNTER — Telehealth: Payer: Self-pay | Admitting: Hematology and Oncology

## 2014-07-27 VITALS — BP 86/51 | HR 89 | Temp 97.5°F | Resp 18 | Ht 65.0 in | Wt 164.4 lb

## 2014-07-27 DIAGNOSIS — B373 Candidiasis of vulva and vagina: Secondary | ICD-10-CM

## 2014-07-27 DIAGNOSIS — K5900 Constipation, unspecified: Secondary | ICD-10-CM

## 2014-07-27 DIAGNOSIS — Z170 Estrogen receptor positive status [ER+]: Secondary | ICD-10-CM | POA: Diagnosis not present

## 2014-07-27 DIAGNOSIS — C50511 Malignant neoplasm of lower-outer quadrant of right female breast: Secondary | ICD-10-CM

## 2014-07-27 DIAGNOSIS — M898X9 Other specified disorders of bone, unspecified site: Secondary | ICD-10-CM

## 2014-07-27 DIAGNOSIS — I959 Hypotension, unspecified: Secondary | ICD-10-CM

## 2014-07-27 LAB — CBC WITH DIFFERENTIAL/PLATELET
BASO%: 0.3 % (ref 0.0–2.0)
BASOS ABS: 0 10*3/uL (ref 0.0–0.1)
EOS%: 0.7 % (ref 0.0–7.0)
Eosinophils Absolute: 0.1 10*3/uL (ref 0.0–0.5)
HCT: 40.1 % (ref 34.8–46.6)
HEMOGLOBIN: 13.5 g/dL (ref 11.6–15.9)
LYMPH%: 20.4 % (ref 14.0–49.7)
MCH: 31.1 pg (ref 25.1–34.0)
MCHC: 33.6 g/dL (ref 31.5–36.0)
MCV: 92.6 fL (ref 79.5–101.0)
MONO#: 0 10*3/uL — ABNORMAL LOW (ref 0.1–0.9)
MONO%: 0.1 % (ref 0.0–14.0)
NEUT%: 78.5 % — ABNORMAL HIGH (ref 38.4–76.8)
NEUTROS ABS: 8.3 10*3/uL — AB (ref 1.5–6.5)
PLATELETS: 198 10*3/uL (ref 145–400)
RBC: 4.33 10*6/uL (ref 3.70–5.45)
RDW: 13 % (ref 11.2–14.5)
WBC: 10.6 10*3/uL — ABNORMAL HIGH (ref 3.9–10.3)
lymph#: 2.2 10*3/uL (ref 0.9–3.3)

## 2014-07-27 LAB — COMPREHENSIVE METABOLIC PANEL (CC13)
ALK PHOS: 62 U/L (ref 40–150)
ALT: 15 U/L (ref 0–55)
AST: 17 U/L (ref 5–34)
Albumin: 3.3 g/dL — ABNORMAL LOW (ref 3.5–5.0)
Anion Gap: 13 mEq/L — ABNORMAL HIGH (ref 3–11)
BUN: 18 mg/dL (ref 7.0–26.0)
CO2: 25 mEq/L (ref 22–29)
Calcium: 9.3 mg/dL (ref 8.4–10.4)
Chloride: 102 mEq/L (ref 98–109)
Creatinine: 0.9 mg/dL (ref 0.6–1.1)
EGFR: 68 mL/min/{1.73_m2} — ABNORMAL LOW (ref >90)
Glucose: 105 mg/dl (ref 70–140)
Potassium: 4.2 mEq/L (ref 3.5–5.1)
Sodium: 139 mEq/L (ref 136–145)
Total Bilirubin: 0.55 mg/dL (ref 0.20–1.20)
Total Protein: 6.3 g/dL — ABNORMAL LOW (ref 6.4–8.3)

## 2014-07-27 MED ORDER — FLUCONAZOLE 100 MG PO TABS: 100.0000 mg | ORAL_TABLET | Freq: Every day | ORAL | Status: DC

## 2014-07-27 NOTE — Telephone Encounter (Signed)
Called and left a message with 5/31 appointments and kristin is ok per terri   Webb Silversmith

## 2014-07-27 NOTE — Progress Notes (Signed)
Patient Care Team: L.Donnie Coffin, MD as PCP - General (Family Medicine) Autumn Messing III, MD as Consulting Physician (General Surgery) Nicholas Lose, MD as Consulting Physician (Hematology and Oncology) Arloa Koh, MD as Consulting Physician (Radiation Oncology) Rockwell Germany, RN as Registered Nurse Mauro Kaufmann, RN as Registered Nurse Holley Bouche, NP as Nurse Practitioner (Nurse Practitioner)  DIAGNOSIS: Breast cancer of lower-outer quadrant of right female breast   Staging form: Breast, AJCC 7th Edition     Clinical stage from 05/12/2014: Stage IA (T1b, N0, M0) - Unsigned       Staging comments: Staged at breast conference 3.2.16      Pathologic stage from 06/25/2014: Stage IA (T1b, N0, cM0) - Signed by Enid Cutter, MD on 07/21/2014       Staging comments: Staged on final lumpectomy specimen by Dr. Lyndon Code    SUMMARY OF ONCOLOGIC HISTORY:   Breast cancer of lower-outer quadrant of right female breast   04/30/2014 Initial Diagnosis Right breast biopsy 6:00: Invasive mammary cancer favor ductal, grade 2, ER 96%, PR 45%, Ki-67 49%, HER-2 negative ratio 1.03   05/11/2014 Breast MRI Right breast 6:00 position: 0.9 x 0.7 x 0.6 cm mass, right breast 1 cm T2 hyperintense enhancing mass suggestive of benign fibroadenoma which has been stable   06/24/2014 Surgery IDC Grade 2, 0.9 cm, 3 SLN Neg T1bN0 (Stage 1A) with IG DCIS, Oncotype DX recurrence score 28, 18% risk of recurrence   07/20/2014 -  Chemotherapy Taxotere Cytoxan Q 3 weeks X 4    CHIEF COMPLIANT: Cycle 1 day 8 Taxotere and Cytoxan  INTERVAL HISTORY: Tammy Day is a 64 year old with above-mentioned history of right breast cancer currently on adjuvant chemotherapy with Taxotere and Cytoxan today cycle 1 day 8. Overall she tolerated chemotherapy fairly well. She did not have any nausea or vomiting. She had great lot of appetite related to Decadron. She does complain of a vaginal yeast infection. She does feel mildly tired. She  also constipation. She had a lot of bone pain related to Neulasta.  REVIEW OF SYSTEMS:   Constitutional: Denies fevers, chills or abnormal weight loss Eyes: Denies blurriness of vision Ears, nose, mouth, throat, and face: Denies mucositis or sore throat Respiratory: Denies cough, dyspnea or wheezes Cardiovascular: Denies palpitation, chest discomfort or lower extremity swelling Gastrointestinal:  Constipation Skin: Denies abnormal skin rashes Lymphatics: Denies new lymphadenopathy or easy bruising Neurological:Denies numbness, tingling or new weaknesses Behavioral/Psych: Mood is stable, no new changes  Breast:  denies any pain or lumps or nodules in either breasts All other systems were reviewed with the patient and are negative.  I have reviewed the past medical history, past surgical history, social history and family history with the patient and they are unchanged from previous note.  ALLERGIES:  is allergic to atorvastatin; nexium; other; pravastatin; prilosec; and rabeprazole.  MEDICATIONS:  Current Outpatient Prescriptions  Medication Sig Dispense Refill  . Ascorbic Acid 125 MG CHEW Chew by mouth.    Marland Kitchen aspirin 81 MG tablet Take 81 mg by mouth daily.    Marland Kitchen azelastine (ASTELIN) 0.1 % nasal spray Place into both nostrils 2 (two) times daily. Use in each nostril as directed    . Calcium-Phosphorus-Vitamin D (CALCIUM/D3 ADULT GUMMIES PO) Take by mouth.    . colesevelam (WELCHOL) 625 MG tablet Take 625 mg by mouth daily. 1 tab in the am, 2 in the pm.    . dexamethasone (DECADRON) 4 MG tablet Take 1 tablet (4 mg total)  by mouth 2 (two) times daily. Start the day before Taxotere. Then again the day after chemo for 3 days. 30 tablet 1  . fluticasone (FLONASE) 50 MCG/ACT nasal spray   1  . loperamide (IMODIUM) 2 MG capsule Take by mouth as needed for diarrhea or loose stools.    Marland Kitchen LORazepam (ATIVAN) 0.5 MG tablet Take 1 tablet (0.5 mg total) by mouth at bedtime. 30 tablet 0  . montelukast  (SINGULAIR) 10 MG tablet Take 10 mg by mouth at bedtime.    . Multiple Vitamins-Minerals (ONE-A-DAY VITACRAVES PO) Take by mouth daily.    . Omega-3 Fatty Acids (FISH OIL) 1200 MG CAPS Take by mouth daily.    . ondansetron (ZOFRAN) 8 MG tablet Take 1 tablet (8 mg total) by mouth 2 (two) times daily. Start the day after chemo for 3 days. Then take as needed for nausea or vomiting. 30 tablet 1  . oxyCODONE-acetaminophen (ROXICET) 5-325 MG per tablet Take 1-2 tablets by mouth every 4 (four) hours as needed. 50 tablet 0  . prochlorperazine (COMPAZINE) 10 MG tablet Take 1 tablet (10 mg total) by mouth every 6 (six) hours as needed (Nausea or vomiting). 30 tablet 1  . RABEprazole (ACIPHEX) 20 MG tablet Take 20 mg by mouth as needed.     Marland Kitchen telmisartan-hydrochlorothiazide (MICARDIS HCT) 80-25 MG per tablet Take by mouth daily. 1/2 tab daily    . triamcinolone cream (KENALOG) 0.1 % Apply 1 application topically 2 (two) times daily. As needed    . UNABLE TO FIND 1 each by Other route as needed. 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 UNABLE TO FIND 1 each by Other route once. Dispense cranial prosthesis per medical necessity due to chemotherapy secondary to known breast cancer diagnosis 1 each 0  . cholestyramine (QUESTRAN) 4 GM/DOSE powder Take by mouth daily.     No current facility-administered medications for this visit.    PHYSICAL EXAMINATION: ECOG PERFORMANCE STATUS: 1 - Symptomatic but completely ambulatory  Filed Vitals:   07/27/14 0815  BP: 86/51  Pulse: 89  Temp: 97.5 F (36.4 C)  Resp: 18   Filed Weights   07/27/14 0815  Weight: 164 lb 6.4 oz (74.571 kg)    GENERAL:alert, no distress and comfortable SKIN: skin color, texture, turgor are normal, no rashes or significant lesions EYES: normal, Conjunctiva are pink and non-injected, sclera clear OROPHARYNX:no exudate, no erythema and lips, buccal  mucosa, and tongue normal  NECK: supple, thyroid normal size, non-tender, without nodularity LYMPH:  no palpable lymphadenopathy in the cervical, axillary or inguinal LUNGS: clear to auscultation and percussion with normal breathing effort HEART: regular rate & rhythm and no murmurs and no lower extremity edema ABDOMEN:abdomen soft, non-tender and normal bowel sounds Musculoskeletal:no cyanosis of digits and no clubbing  NEURO: alert & oriented x 3 with fluent speech, no focal motor/sensory deficits  LABORATORY DATA:  I have reviewed the data as listed   Chemistry      Component Value Date/Time   NA 141 07/20/2014 0814   K 4.2 07/20/2014 0814   CO2 18* 07/20/2014 0814   BUN 16.5 07/20/2014 0814   CREATININE 0.8 07/20/2014 0814      Component Value Date/Time   CALCIUM 9.4 07/20/2014 0814   ALKPHOS 64 07/20/2014 0814   AST 9 07/20/2014 0814   ALT 19 07/20/2014 0814   BILITOT 0.37 07/20/2014 6629  Lab Results  Component Value Date   WBC 11.1* 07/20/2014   HGB 13.2 07/20/2014   HCT 39.3 07/20/2014   MCV 92.2 07/20/2014   PLT 295 07/20/2014   NEUTROABS 9.9* 07/20/2014    ASSESSMENT & PLAN:  Breast cancer of lower-outer quadrant of right female breast Right breast invasive ductal carcinoma, grade 2, 0.9 cm, 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  Treatment plan: Taxotere Cytoxan every 3 weeks 4 cycles Today is cycle 1 day 8  Chemo toxicities: 1. Difficulty with sleeping due to Decadron: I decreased the dosage of Decadron to half tablet twice a day 2. Constipation: Patient plans to take Colace for now ( previously patient had irritable bowel syndrome and was using Questran) 3. Vaginal yeast infection: Prescribed oral Diflucan for 3 days 4. Bone pain due to Neulasta: She will take Claritin-D for a week 5. Hypotension: Instructed  her to discontinue blood pressure medication.  Labs were not done. We will send her to the lab to get blood work done I will call her with the results. Monitoring closely for toxicities  RTC in 2 weeks for cycle 2    No orders of the defined types were placed in this encounter.   The patient has a good understanding of the overall plan. she agrees with it. she will call with any problems that may develop before the next visit here.   Rulon Eisenmenger, MD

## 2014-08-02 ENCOUNTER — Encounter: Payer: Self-pay | Admitting: Hematology and Oncology

## 2014-08-04 ENCOUNTER — Other Ambulatory Visit: Payer: Self-pay | Admitting: *Deleted

## 2014-08-06 ENCOUNTER — Other Ambulatory Visit: Payer: Self-pay | Admitting: *Deleted

## 2014-08-06 ENCOUNTER — Telehealth: Payer: Self-pay | Admitting: Hematology and Oncology

## 2014-08-06 NOTE — Telephone Encounter (Signed)
Printed  For Dr. Lindi Adie.

## 2014-08-06 NOTE — Telephone Encounter (Signed)
Added appt pt will pick up new sched at next visit

## 2014-08-10 ENCOUNTER — Other Ambulatory Visit (HOSPITAL_BASED_OUTPATIENT_CLINIC_OR_DEPARTMENT_OTHER): Payer: BC Managed Care – PPO

## 2014-08-10 ENCOUNTER — Ambulatory Visit (HOSPITAL_BASED_OUTPATIENT_CLINIC_OR_DEPARTMENT_OTHER): Payer: BC Managed Care – PPO | Admitting: Oncology

## 2014-08-10 ENCOUNTER — Ambulatory Visit (HOSPITAL_BASED_OUTPATIENT_CLINIC_OR_DEPARTMENT_OTHER): Payer: BC Managed Care – PPO

## 2014-08-10 ENCOUNTER — Other Ambulatory Visit: Payer: BC Managed Care – PPO

## 2014-08-10 ENCOUNTER — Encounter: Payer: Self-pay | Admitting: Oncology

## 2014-08-10 ENCOUNTER — Ambulatory Visit: Payer: BC Managed Care – PPO

## 2014-08-10 VITALS — BP 133/86 | HR 90 | Temp 99.4°F | Resp 18 | Ht 65.0 in | Wt 166.5 lb

## 2014-08-10 DIAGNOSIS — M898X9 Other specified disorders of bone, unspecified site: Secondary | ICD-10-CM | POA: Diagnosis not present

## 2014-08-10 DIAGNOSIS — C50511 Malignant neoplasm of lower-outer quadrant of right female breast: Secondary | ICD-10-CM

## 2014-08-10 DIAGNOSIS — Z5189 Encounter for other specified aftercare: Secondary | ICD-10-CM | POA: Diagnosis not present

## 2014-08-10 DIAGNOSIS — K5900 Constipation, unspecified: Secondary | ICD-10-CM

## 2014-08-10 DIAGNOSIS — Z5111 Encounter for antineoplastic chemotherapy: Secondary | ICD-10-CM

## 2014-08-10 DIAGNOSIS — I959 Hypotension, unspecified: Secondary | ICD-10-CM

## 2014-08-10 DIAGNOSIS — Z170 Estrogen receptor positive status [ER+]: Secondary | ICD-10-CM

## 2014-08-10 LAB — COMPREHENSIVE METABOLIC PANEL (CC13)
ALT: 26 U/L (ref 0–55)
AST: 12 U/L (ref 5–34)
Albumin: 3.7 g/dL (ref 3.5–5.0)
Alkaline Phosphatase: 57 U/L (ref 40–150)
Anion Gap: 10 mEq/L (ref 3–11)
BUN: 13.4 mg/dL (ref 7.0–26.0)
CHLORIDE: 111 meq/L — AB (ref 98–109)
CO2: 20 mEq/L — ABNORMAL LOW (ref 22–29)
CREATININE: 0.7 mg/dL (ref 0.6–1.1)
Calcium: 9.1 mg/dL (ref 8.4–10.4)
EGFR: 88 mL/min/{1.73_m2} — ABNORMAL LOW (ref >90)
Glucose: 110 mg/dl (ref 70–140)
Potassium: 3.6 mEq/L (ref 3.5–5.1)
Sodium: 141 mEq/L (ref 136–145)
Total Bilirubin: 0.36 mg/dL (ref 0.20–1.20)
Total Protein: 6.8 g/dL (ref 6.4–8.3)

## 2014-08-10 LAB — CBC WITH DIFFERENTIAL/PLATELET
BASO%: 0.1 % (ref 0.0–2.0)
BASOS ABS: 0 10*3/uL (ref 0.0–0.1)
EOS ABS: 0 10*3/uL (ref 0.0–0.5)
EOS%: 0 % (ref 0.0–7.0)
HCT: 34.2 % — ABNORMAL LOW (ref 34.8–46.6)
HEMOGLOBIN: 11.6 g/dL (ref 11.6–15.9)
LYMPH%: 9.2 % — ABNORMAL LOW (ref 14.0–49.7)
MCH: 30.9 pg (ref 25.1–34.0)
MCHC: 34 g/dL (ref 31.5–36.0)
MCV: 90.9 fL (ref 79.5–101.0)
MONO#: 0.8 10*3/uL (ref 0.1–0.9)
MONO%: 5.8 % (ref 0.0–14.0)
NEUT%: 84.9 % — ABNORMAL HIGH (ref 38.4–76.8)
NEUTROS ABS: 11.1 10*3/uL — AB (ref 1.5–6.5)
Platelets: 414 10*3/uL — ABNORMAL HIGH (ref 145–400)
RBC: 3.77 10*6/uL (ref 3.70–5.45)
RDW: 13.3 % (ref 11.2–14.5)
WBC: 13 10*3/uL — AB (ref 3.9–10.3)
lymph#: 1.2 10*3/uL (ref 0.9–3.3)

## 2014-08-10 MED ORDER — SODIUM CHLORIDE 0.9 % IV SOLN
Freq: Once | INTRAVENOUS | Status: AC
  Administered 2014-08-10: 14:00:00 via INTRAVENOUS
  Filled 2014-08-10: qty 1

## 2014-08-10 MED ORDER — DOCETAXEL CHEMO INJECTION 160 MG/16ML
75.0000 mg/m2 | Freq: Once | INTRAVENOUS | Status: AC
  Administered 2014-08-10: 140 mg via INTRAVENOUS
  Filled 2014-08-10: qty 14

## 2014-08-10 MED ORDER — SODIUM CHLORIDE 0.9 % IV SOLN
600.0000 mg/m2 | Freq: Once | INTRAVENOUS | Status: AC
  Administered 2014-08-10: 1120 mg via INTRAVENOUS
  Filled 2014-08-10: qty 56

## 2014-08-10 MED ORDER — PEGFILGRASTIM 6 MG/0.6ML ~~LOC~~ PSKT
6.0000 mg | PREFILLED_SYRINGE | Freq: Once | SUBCUTANEOUS | Status: AC
  Administered 2014-08-10: 6 mg via SUBCUTANEOUS
  Filled 2014-08-10: qty 0.6

## 2014-08-10 MED ORDER — SODIUM CHLORIDE 0.9 % IV SOLN
Freq: Once | INTRAVENOUS | Status: AC
  Administered 2014-08-10: 14:00:00 via INTRAVENOUS

## 2014-08-10 MED ORDER — PALONOSETRON HCL INJECTION 0.25 MG/5ML
0.2500 mg | Freq: Once | INTRAVENOUS | Status: AC
  Administered 2014-08-10: 0.25 mg via INTRAVENOUS

## 2014-08-10 MED ORDER — PALONOSETRON HCL INJECTION 0.25 MG/5ML
INTRAVENOUS | Status: AC
  Filled 2014-08-10: qty 5

## 2014-08-10 NOTE — Patient Instructions (Signed)
[place] Discharge Instructions for Patients Receiving Chemotherapy  Today you received the following chemotherapy agents: Taxotere, Cytoxan  To help prevent nausea and vomiting after your treatment, we encourage you to take your nausea medication as prescribed by your physician.   If you develop nausea and vomiting that is not controlled by your nausea medication, call the clinic.   BELOW ARE SYMPTOMS THAT SHOULD BE REPORTED IMMEDIATELY:  *FEVER GREATER THAN 100.5 F  *CHILLS WITH OR WITHOUT FEVER  NAUSEA AND VOMITING THAT IS NOT CONTROLLED WITH YOUR NAUSEA MEDICATION  *UNUSUAL SHORTNESS OF BREATH  *UNUSUAL BRUISING OR BLEEDING  TENDERNESS IN MOUTH AND THROAT WITH OR WITHOUT PRESENCE OF ULCERS  *URINARY PROBLEMS  *BOWEL PROBLEMS  UNUSUAL RASH Items with * indicate a potential emergency and should be followed up as soon as possible.  Feel free to call the clinic you have any questions or concerns. The clinic phone number is [phone].  Please show the [place] at check-in to the Emergency Department and triage nurse.

## 2014-08-10 NOTE — Progress Notes (Signed)
Patient Care Team: L.Donnie Coffin, MD as PCP - General (Family Medicine) Autumn Messing III, MD as Consulting Physician (General Surgery) Nicholas Lose, MD as Consulting Physician (Hematology and Oncology) Arloa Koh, MD as Consulting Physician (Radiation Oncology) Rockwell Germany, RN as Registered Nurse Mauro Kaufmann, RN as Registered Nurse Holley Bouche, NP as Nurse Practitioner (Nurse Practitioner)  DIAGNOSIS: Breast cancer of lower-outer quadrant of right female breast   Staging form: Breast, AJCC 7th Edition     Clinical stage from 05/12/2014: Stage IA (T1b, N0, M0) - Unsigned       Staging comments: Staged at breast conference 3.2.16      Pathologic stage from 06/25/2014: Stage IA (T1b, N0, cM0) - Signed by Enid Cutter, MD on 07/21/2014       Staging comments: Staged on final lumpectomy specimen by Dr. Lyndon Code    SUMMARY OF ONCOLOGIC HISTORY:   Breast cancer of lower-outer quadrant of right female breast   04/30/2014 Initial Diagnosis Right breast biopsy 6:00: Invasive mammary cancer favor ductal, grade 2, ER 96%, PR 45%, Ki-67 49%, HER-2 negative ratio 1.03   05/11/2014 Breast MRI Right breast 6:00 position: 0.9 x 0.7 x 0.6 cm mass, right breast 1 cm T2 hyperintense enhancing mass suggestive of benign fibroadenoma which has been stable   06/24/2014 Surgery IDC Grade 2, 0.9 cm, 3 SLN Neg T1bN0 (Stage 1A) with IG DCIS, Oncotype DX recurrence score 28, 18% risk of recurrence   07/20/2014 -  Chemotherapy Taxotere Cytoxan Q 3 weeks X 4    CHIEF COMPLIANT: Cycle 2 day 1 Taxotere and Cytoxan  INTERVAL HISTORY: Tammy Day is a 64 year old with above-mentioned history of right breast cancer currently on adjuvant chemotherapy with Taxotere and Cytoxan today cycle 2 day 1. Overall she is tolerating chemotherapy fairly well. She does not have any nausea or vomiting. She had great lot of appetite related to Decadron. She does feel mildly tired.   REVIEW OF SYSTEMS:   Constitutional: Denies  fevers, chills or abnormal weight loss Eyes: Denies blurriness of vision Ears, nose, mouth, throat, and face: Denies mucositis or sore throat Respiratory: Denies cough, dyspnea or wheezes Cardiovascular: Denies palpitation, chest discomfort or lower extremity swelling Gastrointestinal:  Constipation Skin: Denies abnormal skin rashes Lymphatics: Denies new lymphadenopathy or easy bruising Neurological:Denies numbness, tingling or new weaknesses Behavioral/Psych: Mood is stable, no new changes  Breast:  denies any pain or lumps or nodules in either breasts All other systems were reviewed with the patient and are negative.  I have reviewed the past medical history, past surgical history, social history and family history with the patient and they are unchanged from previous note.  ALLERGIES:  is allergic to atorvastatin; nexium; other; pravastatin; prilosec; and rabeprazole.  MEDICATIONS:  Current Outpatient Prescriptions  Medication Sig Dispense Refill  . aspirin 81 MG tablet Take 81 mg by mouth daily.    Marland Kitchen azelastine (ASTELIN) 0.1 % nasal spray Place into both nostrils 2 (two) times daily. Use in each nostril as directed    . Calcium-Phosphorus-Vitamin D (CALCIUM/D3 ADULT GUMMIES PO) Take by mouth.    . cholestyramine (QUESTRAN) 4 GM/DOSE powder Take by mouth daily.    . colesevelam (WELCHOL) 625 MG tablet Take 625 mg by mouth daily. 1 tab in the am, 2 in the pm.    . dexamethasone (DECADRON) 4 MG tablet Take 1 tablet (4 mg total) by mouth 2 (two) times daily. Start the day before Taxotere. Then again the day after chemo for  3 days. 30 tablet 1  . fluticasone (FLONASE) 50 MCG/ACT nasal spray   1  . loperamide (IMODIUM) 2 MG capsule Take by mouth as needed for diarrhea or loose stools.    Marland Kitchen LORazepam (ATIVAN) 0.5 MG tablet Take 1 tablet (0.5 mg total) by mouth at bedtime. 30 tablet 0  . montelukast (SINGULAIR) 10 MG tablet Take 10 mg by mouth at bedtime.    . Multiple Vitamins-Minerals  (ONE-A-DAY VITACRAVES PO) Take by mouth daily.    . Omega-3 Fatty Acids (FISH OIL) 1200 MG CAPS Take by mouth daily.    . ondansetron (ZOFRAN) 8 MG tablet Take 1 tablet (8 mg total) by mouth 2 (two) times daily. Start the day after chemo for 3 days. Then take as needed for nausea or vomiting. 30 tablet 1  . prochlorperazine (COMPAZINE) 10 MG tablet Take 1 tablet (10 mg total) by mouth every 6 (six) hours as needed (Nausea or vomiting). 30 tablet 1  . RABEprazole (ACIPHEX) 20 MG tablet Take 20 mg by mouth as needed.     . triamcinolone cream (KENALOG) 0.1 % Apply 1 application topically 2 (two) times daily. As needed    . UNABLE TO FIND 1 each by Other route as needed. 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 UNABLE TO FIND 1 each by Other route once. Dispense cranial prosthesis per medical necessity due to chemotherapy secondary to known breast cancer diagnosis 1 each 0  . Ascorbic Acid 125 MG CHEW Chew by mouth.    . oxyCODONE-acetaminophen (ROXICET) 5-325 MG per tablet Take 1-2 tablets by mouth every 4 (four) hours as needed. (Patient not taking: Reported on 08/10/2014) 50 tablet 0  . telmisartan-hydrochlorothiazide (MICARDIS HCT) 80-25 MG per tablet Take by mouth daily. 1/2 tab daily     No current facility-administered medications for this visit.   Facility-Administered Medications Ordered in Other Visits  Medication Dose Route Frequency Provider Last Rate Last Dose  . cyclophosphamide (CYTOXAN) 1,120 mg in sodium chloride 0.9 % 250 mL chemo infusion  600 mg/m2 (Treatment Plan Actual) Intravenous Once Nicholas Lose, MD 612 mL/hr at 08/10/14 1600 1,120 mg at 08/10/14 1600  . pegfilgrastim (NEULASTA ONPRO KIT) injection 6 mg  6 mg Subcutaneous Once Nicholas Lose, MD        PHYSICAL EXAMINATION: ECOG PERFORMANCE STATUS: 1 - Symptomatic but completely ambulatory  Filed Vitals:   08/10/14 1301  BP: 133/86   Pulse: 90  Temp: 99.4 F (37.4 C)  Resp: 18   Filed Weights   08/10/14 1301  Weight: 166 lb 8 oz (75.524 kg)    GENERAL:alert, no distress and comfortable SKIN: skin color, texture, turgor are normal, no rashes or significant lesions EYES: normal, Conjunctiva are pink and non-injected, sclera clear OROPHARYNX:no exudate, no erythema and lips, buccal mucosa, and tongue normal  NECK: supple, thyroid normal size, non-tender, without nodularity LYMPH:  no palpable lymphadenopathy in the cervical, axillary or inguinal LUNGS: clear to auscultation and percussion with normal breathing effort HEART: regular rate & rhythm and no murmurs and no lower extremity edema ABDOMEN:abdomen soft, non-tender and normal bowel sounds Musculoskeletal:no cyanosis of digits and no clubbing  NEURO: alert & oriented x 3 with fluent speech, no focal motor/sensory deficits  LABORATORY DATA:  I have reviewed the data as listed   Chemistry      Component Value Date/Time   NA 141 08/10/2014 1248   K 3.6  08/10/2014 1248   CO2 20* 08/10/2014 1248   BUN 13.4 08/10/2014 1248   CREATININE 0.7 08/10/2014 1248      Component Value Date/Time   CALCIUM 9.1 08/10/2014 1248   ALKPHOS 57 08/10/2014 1248   AST 12 08/10/2014 1248   ALT 26 08/10/2014 1248   BILITOT 0.36 08/10/2014 1248       Lab Results  Component Value Date   WBC 13.0* 08/10/2014   HGB 11.6 08/10/2014   HCT 34.2* 08/10/2014   MCV 90.9 08/10/2014   PLT 414* 08/10/2014   NEUTROABS 11.1* 08/10/2014    ASSESSMENT & PLAN:  Breast cancer of lower-outer quadrant of right female breast Right breast invasive ductal carcinoma, grade 2, 0.9 cm, 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  Treatment plan: Taxotere Cytoxan every 3 weeks 4 cycles Today is cycle 2 day 1. Counts have been reviewed and she will  proceed with cycle 2 for chemotherapy today without dose reduction or delay.  Chemo toxicities: 1. Difficulty with sleeping due to Decadron: She was reminded to decrease the dosage of Decadron to half tablet twice a day 2. Constipation: Patient plans to take Colace for now ( previously patient had irritable bowel syndrome and was using Questran) 3. Vaginal yeast infection: Prescribed oral Diflucan for 3 days. Resolved. 4. Bone pain due to Neulasta: She will take Claritin-D for a week 5. Hypotension: She is off her blood pressure medication and her blood pressure has improved.  Monitoring closely for toxicities  RTC in 1 week as already scheduled.    No orders of the defined types were placed in this encounter.   The patient has a good understanding of the overall plan. she agrees with it. she will call with any problems that may develop before the next visit here.   Mikey Bussing, NP

## 2014-08-13 ENCOUNTER — Encounter: Payer: Self-pay | Admitting: Hematology and Oncology

## 2014-08-16 NOTE — Assessment & Plan Note (Signed)
Right breast invasive ductal carcinoma, grade 2, 0.9 cm, 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  Treatment plan: Taxotere Cytoxan every 3 weeks 4 cycles Today is cycle 2 day 8. Counts have been reviewed  Chemo toxicities: 1. Difficulty with sleeping due to Decadron: She was reminded to decrease the dosage of Decadron to half tablet twice a day 2. Constipation: Patient plans to take Colace for now ( previously patient had irritable bowel syndrome and was using Questran) 3. Vaginal yeast infection: Prescribed oral Diflucan for 3 days. Resolved. 4. Bone pain due to Neulasta: She will take Claritin-D for a week 5. Hypotension: She is off her blood pressure medication and her blood pressure has improved.  Monitoring closely for toxicities  RTC in 2 weeks for cycle 3

## 2014-08-17 ENCOUNTER — Other Ambulatory Visit (HOSPITAL_BASED_OUTPATIENT_CLINIC_OR_DEPARTMENT_OTHER): Payer: BC Managed Care – PPO

## 2014-08-17 ENCOUNTER — Telehealth: Payer: Self-pay | Admitting: Hematology and Oncology

## 2014-08-17 ENCOUNTER — Ambulatory Visit (HOSPITAL_BASED_OUTPATIENT_CLINIC_OR_DEPARTMENT_OTHER): Payer: BC Managed Care – PPO | Admitting: Hematology and Oncology

## 2014-08-17 VITALS — BP 119/72 | HR 84 | Temp 98.0°F | Wt 170.4 lb

## 2014-08-17 DIAGNOSIS — C50511 Malignant neoplasm of lower-outer quadrant of right female breast: Secondary | ICD-10-CM

## 2014-08-17 DIAGNOSIS — C50811 Malignant neoplasm of overlapping sites of right female breast: Secondary | ICD-10-CM | POA: Diagnosis not present

## 2014-08-17 LAB — COMPREHENSIVE METABOLIC PANEL (CC13)
ALK PHOS: 65 U/L (ref 40–150)
ALT: 18 U/L (ref 0–55)
AST: 18 U/L (ref 5–34)
Albumin: 3.1 g/dL — ABNORMAL LOW (ref 3.5–5.0)
Anion Gap: 7 mEq/L (ref 3–11)
BUN: 9.8 mg/dL (ref 7.0–26.0)
CO2: 26 mEq/L (ref 22–29)
Calcium: 8.9 mg/dL (ref 8.4–10.4)
Chloride: 108 mEq/L (ref 98–109)
Creatinine: 0.7 mg/dL (ref 0.6–1.1)
EGFR: >90 mL/min/{1.73_m2} (ref >90)
GLUCOSE: 106 mg/dL (ref 70–140)
Potassium: 4 mEq/L (ref 3.5–5.1)
Sodium: 141 mEq/L (ref 136–145)
Total Bilirubin: 0.47 mg/dL (ref 0.20–1.20)
Total Protein: 5.8 g/dL — ABNORMAL LOW (ref 6.4–8.3)

## 2014-08-17 LAB — CBC WITH DIFFERENTIAL/PLATELET
BASO%: 4 % — AB (ref 0.0–2.0)
BASOS ABS: 0.3 10*3/uL — AB (ref 0.0–0.1)
EOS%: 1.2 % (ref 0.0–7.0)
Eosinophils Absolute: 0.1 10*3/uL (ref 0.0–0.5)
HCT: 33.8 % — ABNORMAL LOW (ref 34.8–46.6)
HGB: 11.4 g/dL — ABNORMAL LOW (ref 11.6–15.9)
LYMPH%: 17.3 % (ref 14.0–49.7)
MCH: 31.1 pg (ref 25.1–34.0)
MCHC: 33.7 g/dL (ref 31.5–36.0)
MCV: 92.3 fL (ref 79.5–101.0)
MONO#: 1.6 10*3/uL — AB (ref 0.1–0.9)
MONO%: 24 % — ABNORMAL HIGH (ref 0.0–14.0)
NEUT%: 53.5 % (ref 38.4–76.8)
NEUTROS ABS: 3.5 10*3/uL (ref 1.5–6.5)
PLATELETS: 172 10*3/uL (ref 145–400)
RBC: 3.66 10*6/uL — ABNORMAL LOW (ref 3.70–5.45)
RDW: 13.5 % (ref 11.2–14.5)
WBC: 6.6 10*3/uL (ref 3.9–10.3)
lymph#: 1.1 10*3/uL (ref 0.9–3.3)

## 2014-08-17 NOTE — Progress Notes (Signed)
Patient Care Team: L.Donnie Coffin, MD as PCP - General (Family Medicine) Autumn Messing III, MD as Consulting Physician (General Surgery) Nicholas Lose, MD as Consulting Physician (Hematology and Oncology) Arloa Koh, MD as Consulting Physician (Radiation Oncology) Rockwell Germany, RN as Registered Nurse Mauro Kaufmann, RN as Registered Nurse Holley Bouche, NP as Nurse Practitioner (Nurse Practitioner)  DIAGNOSIS: Breast cancer of lower-outer quadrant of right female breast   Staging form: Breast, AJCC 7th Edition     Clinical stage from 05/12/2014: Stage IA (T1b, N0, M0) - Unsigned       Staging comments: Staged at breast conference 3.2.16      Pathologic stage from 06/25/2014: Stage IA (T1b, N0, cM0) - Signed by Enid Cutter, MD on 07/21/2014       Staging comments: Staged on final lumpectomy specimen by Dr. Lyndon Code    SUMMARY OF ONCOLOGIC HISTORY:   Breast cancer of lower-outer quadrant of right female breast   04/30/2014 Initial Diagnosis Right breast biopsy 6:00: Invasive mammary cancer favor ductal, grade 2, ER 96%, PR 45%, Ki-67 49%, HER-2 negative ratio 1.03   05/11/2014 Breast MRI Right breast 6:00 position: 0.9 x 0.7 x 0.6 cm mass, right breast 1 cm T2 hyperintense enhancing mass suggestive of benign fibroadenoma which has been stable   06/24/2014 Surgery IDC Grade 2, 0.9 cm, 3 SLN Neg T1bN0 (Stage 1A) with IG DCIS, Oncotype DX recurrence score 28, 18% risk of recurrence   07/20/2014 -  Chemotherapy Taxotere Cytoxan Q 3 weeks X 4    CHIEF COMPLIANT: Cycle 2 day 8 Taxotere Cytoxan  INTERVAL HISTORY: Tammy Day is a 64 year old with above-mentioned history right breast cancer currently on adjuvant chemotherapy with Taxotere and Cytoxan. Today is cycle 2 day 8. She tolerated last cycle very well. She did have a lot of energy and appetite after the dexamethasone which was decreased significantly in spite of that she had trouble sleeping and had to take a sleeping pill. Denies any  nausea vomiting. She lost her hair. 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 She is excited about the prospect of retirement.  REVIEW OF SYSTEMS:   Constitutional: Denies fevers, chills or abnormal weight loss, taste changes, alopecia Eyes: Denies blurriness of vision Ears, nose, mouth, throat, and face: Denies mucositis or sore throat Respiratory: Denies cough, dyspnea or wheezes Cardiovascular: Denies palpitation, chest discomfort or lower extremity swelling Gastrointestinal:  Denies nausea, heartburn or change in bowel habits Skin: Denies abnormal skin rashes Lymphatics: Denies new lymphadenopathy or easy bruising Neurological:Denies numbness, tingling or new weaknesses Behavioral/Psych: Mood is stable, no new changes   All other systems were reviewed with the patient and are negative.  I have reviewed the past medical history, past surgical history, social history and family history with the patient and they are unchanged from previous note.  ALLERGIES:  is allergic to atorvastatin; nexium; other; pravastatin; prilosec; and rabeprazole.  MEDICATIONS:  Current Outpatient Prescriptions  Medication Sig Dispense Refill  . Ascorbic Acid 125 MG CHEW Chew by mouth.    Marland Kitchen aspirin 81 MG tablet Take 81 mg by mouth daily.    Marland Kitchen azelastine (ASTELIN) 0.1 % nasal spray Place into both nostrils 2 (two) times daily. Use in each nostril as directed    . Calcium-Phosphorus-Vitamin D (CALCIUM/D3 ADULT GUMMIES PO) Take by mouth.    . cholestyramine (QUESTRAN) 4 GM/DOSE powder Take by mouth daily.    . colesevelam (WELCHOL) 625 MG tablet  Take 625 mg by mouth daily. 1 tab in the am, 2 in the pm.    . dexamethasone (DECADRON) 4 MG tablet Take 1 tablet (4 mg total) by mouth 2 (two) times daily. Start the day before Taxotere. Then again the day after chemo for 3 days. 30 tablet 1  . Docusate Sodium (COLACE PO) Take 1 capsule by mouth as needed.    . fluticasone (FLONASE) 50  MCG/ACT nasal spray   1  . loperamide (IMODIUM) 2 MG capsule Take by mouth as needed for diarrhea or loose stools.    Marland Kitchen LORazepam (ATIVAN) 0.5 MG tablet Take 1 tablet (0.5 mg total) by mouth at bedtime. 30 tablet 0  . montelukast (SINGULAIR) 10 MG tablet Take 10 mg by mouth at bedtime.    . Multiple Vitamins-Minerals (ONE-A-DAY VITACRAVES PO) Take by mouth daily.    . Omega-3 Fatty Acids (FISH OIL) 1200 MG CAPS Take by mouth daily.    . ondansetron (ZOFRAN) 8 MG tablet Take 1 tablet (8 mg total) by mouth 2 (two) times daily. Start the day after chemo for 3 days. Then take as needed for nausea or vomiting. 30 tablet 1  . oxyCODONE-acetaminophen (ROXICET) 5-325 MG per tablet Take 1-2 tablets by mouth every 4 (four) hours as needed. 50 tablet 0  . prochlorperazine (COMPAZINE) 10 MG tablet Take 1 tablet (10 mg total) by mouth every 6 (six) hours as needed (Nausea or vomiting). 30 tablet 1  . RABEprazole (ACIPHEX) 20 MG tablet Take 20 mg by mouth as needed.     . triamcinolone cream (KENALOG) 0.1 % Apply 1 application topically 2 (two) times daily. As needed    . UNABLE TO FIND 1 each by Other route as needed. 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 UNABLE TO FIND 1 each by Other route once. Dispense cranial prosthesis per medical necessity due to chemotherapy secondary to known breast cancer diagnosis 1 each 0  . telmisartan-hydrochlorothiazide (MICARDIS HCT) 80-25 MG per tablet Take by mouth daily. 1/2 tab daily     No current facility-administered medications for this visit.    PHYSICAL EXAMINATION: ECOG PERFORMANCE STATUS: 1 - Symptomatic but completely ambulatory  Filed Vitals:   08/17/14 0828  BP: 119/72  Pulse: 84  Temp: 98 F (36.7 C)   Filed Weights   08/17/14 0828  Weight: 170 lb 7 oz (77.31 kg)    GENERAL:alert, no distress and comfortable, alopecia SKIN: skin color, texture, turgor are  normal, no rashes or significant lesions EYES: normal, Conjunctiva are pink and non-injected, sclera clear OROPHARYNX:no exudate, no erythema and lips, buccal mucosa, and tongue normal  NECK: supple, thyroid normal size, non-tender, without nodularity LYMPH:  no palpable lymphadenopathy in the cervical, axillary or inguinal LUNGS: clear to auscultation and percussion with normal breathing effort HEART: regular rate & rhythm and no murmurs and no lower extremity edema ABDOMEN:abdomen soft, non-tender and normal bowel sounds Musculoskeletal:no cyanosis of digits and no clubbing  NEURO: alert & oriented x 3 with fluent speech, no focal motor/sensory deficits  LABORATORY DATA:  I have reviewed the data as listed   Chemistry      Component Value Date/Time   NA 141 08/10/2014 1248   K 3.6 08/10/2014 1248   CO2 20* 08/10/2014 1248   BUN 13.4 08/10/2014 1248   CREATININE 0.7 08/10/2014 1248      Component Value Date/Time   CALCIUM 9.1  08/10/2014 1248   ALKPHOS 57 08/10/2014 1248   AST 12 08/10/2014 1248   ALT 26 08/10/2014 1248   BILITOT 0.36 08/10/2014 1248       Lab Results  Component Value Date   WBC 6.6 08/17/2014   HGB 11.4* 08/17/2014   HCT 33.8* 08/17/2014   MCV 92.3 08/17/2014   PLT 172 08/17/2014   NEUTROABS 3.5 08/17/2014   ASSESSMENT & PLAN:  Breast cancer of lower-outer quadrant of right female breast Right breast invasive ductal carcinoma, grade 2, 0.9 cm, 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  Treatment plan: Taxotere Cytoxan every 3 weeks 4 cycles Today is cycle 2 day 8. Counts have been reviewed  Chemo toxicities: 1. Difficulty with sleeping due to Decadron: She was reminded to decrease the dosage of Decadron to half tablet twice a day 2. Constipation: Improved with Colace ( previously patient had irritable bowel  syndrome and was using Questran) 3. Vaginal yeast infection: Prescribed oral Diflucan for 3 days. Resolved. 4. Bone pain due to Neulasta: Claritin-D helped her symptoms 5. Hypotension: She is off her blood pressure medication and her blood pressure has improved.  Patient is retiring in 5 days and is very excited about that. Monitoring closely for toxicities  RTC in 2 weeks for cycle 3 withadditional  No orders of the defined types were placed in this encounter.   The patient has a good understanding of the overall plan. she agrees with it. she will call with any problems that may develop before the next visit here.   Rulon Eisenmenger, MD

## 2014-08-17 NOTE — Telephone Encounter (Signed)
Appointments made and avs printed for patient °

## 2014-08-25 ENCOUNTER — Encounter: Payer: Self-pay | Admitting: Hematology and Oncology

## 2014-08-26 ENCOUNTER — Encounter: Payer: Self-pay | Admitting: Hematology and Oncology

## 2014-08-27 NOTE — Telephone Encounter (Signed)
Called pt at 1730 reponse to her emails.  Pt reports tingling.  When tingles starts itching.  Next day rash started - she thinks may be caused by the scratching.  Confirmed with pt that symptoms are from chemo.  Recommended pepcid, benadryl and OTC topical steroid cream and to try to keep hands cool.  Advised pt if get worse or intolerable over the weekend she should go to ED.  Confirmed next pt appt is Tuesday 5/21.  Pt verbalized understanding.

## 2014-08-31 ENCOUNTER — Encounter: Payer: Self-pay | Admitting: Physician Assistant

## 2014-08-31 ENCOUNTER — Ambulatory Visit (HOSPITAL_BASED_OUTPATIENT_CLINIC_OR_DEPARTMENT_OTHER): Payer: BC Managed Care – PPO | Admitting: Physician Assistant

## 2014-08-31 ENCOUNTER — Other Ambulatory Visit (HOSPITAL_BASED_OUTPATIENT_CLINIC_OR_DEPARTMENT_OTHER): Payer: BC Managed Care – PPO

## 2014-08-31 ENCOUNTER — Ambulatory Visit (HOSPITAL_BASED_OUTPATIENT_CLINIC_OR_DEPARTMENT_OTHER): Payer: BC Managed Care – PPO

## 2014-08-31 ENCOUNTER — Telehealth: Payer: Self-pay | Admitting: Hematology and Oncology

## 2014-08-31 VITALS — BP 132/68 | HR 94 | Temp 97.8°F | Resp 17 | Ht 65.0 in | Wt 167.6 lb

## 2014-08-31 DIAGNOSIS — G893 Neoplasm related pain (acute) (chronic): Secondary | ICD-10-CM

## 2014-08-31 DIAGNOSIS — Z5111 Encounter for antineoplastic chemotherapy: Secondary | ICD-10-CM

## 2014-08-31 DIAGNOSIS — C50511 Malignant neoplasm of lower-outer quadrant of right female breast: Secondary | ICD-10-CM

## 2014-08-31 DIAGNOSIS — G4700 Insomnia, unspecified: Secondary | ICD-10-CM | POA: Diagnosis not present

## 2014-08-31 DIAGNOSIS — Z5189 Encounter for other specified aftercare: Secondary | ICD-10-CM

## 2014-08-31 DIAGNOSIS — K5900 Constipation, unspecified: Secondary | ICD-10-CM | POA: Diagnosis not present

## 2014-08-31 LAB — COMPREHENSIVE METABOLIC PANEL (CC13)
ALBUMIN: 3.8 g/dL (ref 3.5–5.0)
ALK PHOS: 57 U/L (ref 40–150)
ALT: 20 U/L (ref 0–55)
AST: 12 U/L (ref 5–34)
Anion Gap: 9 mEq/L (ref 3–11)
BILIRUBIN TOTAL: 0.61 mg/dL (ref 0.20–1.20)
BUN: 14.6 mg/dL (ref 7.0–26.0)
CO2: 23 mEq/L (ref 22–29)
CREATININE: 0.7 mg/dL (ref 0.6–1.1)
Calcium: 9.5 mg/dL (ref 8.4–10.4)
Chloride: 110 mEq/L — ABNORMAL HIGH (ref 98–109)
EGFR: 86 mL/min/{1.73_m2} — ABNORMAL LOW (ref >90)
GLUCOSE: 146 mg/dL — AB (ref 70–140)
Potassium: 3.3 mEq/L — ABNORMAL LOW (ref 3.5–5.1)
Sodium: 142 mEq/L (ref 136–145)
Total Protein: 7.1 g/dL (ref 6.4–8.3)

## 2014-08-31 LAB — CBC WITH DIFFERENTIAL/PLATELET
BASO%: 0.4 % (ref 0.0–2.0)
Basophils Absolute: 0 10*3/uL (ref 0.0–0.1)
EOS%: 0 % (ref 0.0–7.0)
Eosinophils Absolute: 0 10*3/uL (ref 0.0–0.5)
HEMATOCRIT: 37.5 % (ref 34.8–46.6)
HEMOGLOBIN: 12.7 g/dL (ref 11.6–15.9)
LYMPH%: 10.4 % — ABNORMAL LOW (ref 14.0–49.7)
MCH: 31.4 pg (ref 25.1–34.0)
MCHC: 33.8 g/dL (ref 31.5–36.0)
MCV: 92.9 fL (ref 79.5–101.0)
MONO#: 0.6 10*3/uL (ref 0.1–0.9)
MONO%: 5.8 % (ref 0.0–14.0)
NEUT%: 83.4 % — ABNORMAL HIGH (ref 38.4–76.8)
NEUTROS ABS: 8.4 10*3/uL — AB (ref 1.5–6.5)
PLATELETS: 308 10*3/uL (ref 145–400)
RBC: 4.04 10*6/uL (ref 3.70–5.45)
RDW: 14.9 % — AB (ref 11.2–14.5)
WBC: 10.1 10*3/uL (ref 3.9–10.3)
lymph#: 1.1 10*3/uL (ref 0.9–3.3)

## 2014-08-31 MED ORDER — PALONOSETRON HCL INJECTION 0.25 MG/5ML
INTRAVENOUS | Status: AC
Start: 2014-08-31 — End: 2014-08-31
  Filled 2014-08-31: qty 5

## 2014-08-31 MED ORDER — SODIUM CHLORIDE 0.9 % IV SOLN
600.0000 mg/m2 | Freq: Once | INTRAVENOUS | Status: AC
  Administered 2014-08-31: 1120 mg via INTRAVENOUS
  Filled 2014-08-31: qty 56

## 2014-08-31 MED ORDER — DOCETAXEL CHEMO INJECTION 160 MG/16ML
75.0000 mg/m2 | Freq: Once | INTRAVENOUS | Status: AC
  Administered 2014-08-31: 140 mg via INTRAVENOUS
  Filled 2014-08-31: qty 14

## 2014-08-31 MED ORDER — SODIUM CHLORIDE 0.9 % IV SOLN
Freq: Once | INTRAVENOUS | Status: AC
  Administered 2014-08-31: 15:00:00 via INTRAVENOUS
  Filled 2014-08-31: qty 1

## 2014-08-31 MED ORDER — SODIUM CHLORIDE 0.9 % IV SOLN
Freq: Once | INTRAVENOUS | Status: AC
  Administered 2014-08-31: 15:00:00 via INTRAVENOUS

## 2014-08-31 MED ORDER — PALONOSETRON HCL INJECTION 0.25 MG/5ML
0.2500 mg | Freq: Once | INTRAVENOUS | Status: AC
  Administered 2014-08-31: 0.25 mg via INTRAVENOUS

## 2014-08-31 MED ORDER — PEGFILGRASTIM 6 MG/0.6ML ~~LOC~~ PSKT
6.0000 mg | PREFILLED_SYRINGE | Freq: Once | SUBCUTANEOUS | Status: AC
  Administered 2014-08-31: 6 mg via SUBCUTANEOUS
  Filled 2014-08-31: qty 0.6

## 2014-08-31 NOTE — Telephone Encounter (Signed)
Spoke with patient re appointments for 09/21/14. Per AJ no appointments needed between now and 09/21/14. Printer down patient will be given avs report and copy of 09/21/14 appointments in infusion. Also patient is mychart active and aware appointments can be view on mychart.

## 2014-08-31 NOTE — Progress Notes (Signed)
Patient Care Team: L.Donnie Coffin, MD as PCP - General (Family Medicine) Autumn Messing III, MD as Consulting Physician (General Surgery) Nicholas Lose, MD as Consulting Physician (Hematology and Oncology) Arloa Koh, MD as Consulting Physician (Radiation Oncology) Rockwell Germany, RN as Registered Nurse Mauro Kaufmann, RN as Registered Nurse Holley Bouche, NP as Nurse Practitioner (Nurse Practitioner)  DIAGNOSIS: Breast cancer of lower-outer quadrant of right female breast   Staging form: Breast, AJCC 7th Edition     Clinical stage from 05/12/2014: Stage IA (T1b, N0, M0) - Unsigned       Staging comments: Staged at breast conference 3.2.16      Pathologic stage from 06/25/2014: Stage IA (T1b, N0, cM0) - Signed by Enid Cutter, MD on 07/21/2014       Staging comments: Staged on final lumpectomy specimen by Dr. Lyndon Code    SUMMARY OF ONCOLOGIC HISTORY:   Breast cancer of lower-outer quadrant of right female breast   04/30/2014 Initial Diagnosis Right breast biopsy 6:00: Invasive mammary cancer favor ductal, grade 2, ER 96%, PR 45%, Ki-67 49%, HER-2 negative ratio 1.03   05/11/2014 Breast MRI Right breast 6:00 position: 0.9 x 0.7 x 0.6 cm mass, right breast 1 cm T2 hyperintense enhancing mass suggestive of benign fibroadenoma which has been stable   06/24/2014 Surgery IDC Grade 2, 0.9 cm, 3 SLN Neg T1bN0 (Stage 1A) with IG DCIS, Oncotype DX recurrence score 28, 18% risk of recurrence   07/20/2014 -  Chemotherapy Taxotere Cytoxan Q 3 weeks X 4    CHIEF COMPLIANT: Cycle 3 day 1 Taxotere Cytoxan  INTERVAL HISTORY: Tammy Day is a 64 year old with above-mentioned history right breast cancer currently on adjuvant chemotherapy with Taxotere and Cytoxan. Today is cycle 3 day 1. She tolerated last cycle very well. She did note some itching on her fingers with development of some "fine bumps". The symptoms are better with Benadryl and hydrocortisone cream. She's also had some mild constipation after  chemotherapy. This is well managed with Colace. She voiced no other specific complaints today.   REVIEW OF SYSTEMS:   Constitutional: Denies fevers, chills or abnormal weight loss, taste changes, alopecia Eyes: Denies blurriness of vision Ears, nose, mouth, throat, and face: Denies mucositis or sore throat Respiratory: Denies cough, dyspnea or wheezes Cardiovascular: Denies palpitation, chest discomfort or lower extremity swelling Gastrointestinal:  Denies nausea, heartburn or change in bowel habits Skin: Denies abnormal skin rashes Lymphatics: Denies new lymphadenopathy or easy bruising Neurological:Denies numbness, tingling or new weaknesses Behavioral/Psych: Mood is stable, no new changes   All other systems were reviewed with the patient and are negative.  I have reviewed the past medical history, past surgical history, social history and family history with the patient and they are unchanged from previous note.  ALLERGIES:  is allergic to atorvastatin; nexium; other; pravastatin; prilosec; and rabeprazole.  MEDICATIONS:  Current Outpatient Prescriptions  Medication Sig Dispense Refill  . Ascorbic Acid 125 MG CHEW Chew by mouth.    Marland Kitchen aspirin 81 MG tablet Take 81 mg by mouth daily.    Marland Kitchen azelastine (ASTELIN) 0.1 % nasal spray Place into both nostrils 2 (two) times daily. Use in each nostril as directed    . Calcium-Phosphorus-Vitamin D (CALCIUM/D3 ADULT GUMMIES PO) Take by mouth.    . cholestyramine (QUESTRAN) 4 GM/DOSE powder Take by mouth daily.    . colesevelam (WELCHOL) 625 MG tablet Take 625 mg by mouth daily. 1 tab in the am, 2 in the pm.    .  dexamethasone (DECADRON) 4 MG tablet Take 1 tablet (4 mg total) by mouth 2 (two) times daily. Start the day before Taxotere. Then again the day after chemo for 3 days. 30 tablet 1  . diphenhydrAMINE (BENADRYL) 25 MG tablet Take 25 mg by mouth every 6 (six) hours as needed.    Mariane Baumgarten Sodium (COLACE PO) Take 1 capsule by mouth as  needed.    . fluticasone (FLONASE) 50 MCG/ACT nasal spray   1  . hydrocortisone cream 0.5 % Apply 1 application topically 2 (two) times daily.    Marland Kitchen loperamide (IMODIUM) 2 MG capsule Take by mouth as needed for diarrhea or loose stools.    Marland Kitchen LORazepam (ATIVAN) 0.5 MG tablet Take 1 tablet (0.5 mg total) by mouth at bedtime. 30 tablet 0  . montelukast (SINGULAIR) 10 MG tablet Take 10 mg by mouth at bedtime.    . Multiple Vitamins-Minerals (ONE-A-DAY VITACRAVES PO) Take by mouth daily.    . Omega-3 Fatty Acids (FISH OIL) 1200 MG CAPS Take by mouth daily.    . ondansetron (ZOFRAN) 8 MG tablet Take 1 tablet (8 mg total) by mouth 2 (two) times daily. Start the day after chemo for 3 days. Then take as needed for nausea or vomiting. 30 tablet 1  . oxyCODONE-acetaminophen (ROXICET) 5-325 MG per tablet Take 1-2 tablets by mouth every 4 (four) hours as needed. 50 tablet 0  . prochlorperazine (COMPAZINE) 10 MG tablet Take 1 tablet (10 mg total) by mouth every 6 (six) hours as needed (Nausea or vomiting). 30 tablet 1  . RABEprazole (ACIPHEX) 20 MG tablet Take 20 mg by mouth as needed.     Marland Kitchen telmisartan-hydrochlorothiazide (MICARDIS HCT) 80-25 MG per tablet Take by mouth daily. 1/2 tab daily    . triamcinolone cream (KENALOG) 0.1 % Apply 1 application topically 2 (two) times daily. As needed    . UNABLE TO FIND 1 each by Other route as needed. 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 UNABLE TO FIND 1 each by Other route once. Dispense cranial prosthesis per medical necessity due to chemotherapy secondary to known breast cancer diagnosis 1 each 0   No current facility-administered medications for this visit.   Facility-Administered Medications Ordered in Other Visits  Medication Dose Route Frequency Provider Last Rate Last Dose  . cyclophosphamide (CYTOXAN) 1,120 mg in sodium chloride 0.9 % 250 mL chemo infusion  600 mg/m2  (Treatment Plan Actual) Intravenous Once Nicholas Lose, MD      . pegfilgrastim (NEULASTA ONPRO KIT) injection 6 mg  6 mg Subcutaneous Once Nicholas Lose, MD        PHYSICAL EXAMINATION: ECOG PERFORMANCE STATUS: 1 - Symptomatic but completely ambulatory  Filed Vitals:   08/31/14 1347  BP: 132/68  Pulse: 94  Temp: 97.8 F (36.6 C)  Resp: 17   Filed Weights   08/31/14 1347  Weight: 167 lb 9.6 oz (76.023 kg)    GENERAL:alert, no distress and comfortable, alopecia SKIN: skin color, texture, turgor are normal, no rashes or significant lesions EYES: normal, Conjunctiva are pink and non-injected, sclera clear OROPHARYNX:no exudate, no erythema and lips, buccal mucosa, and tongue normal  NECK: supple, thyroid normal size, non-tender, without nodularity LYMPH:  no palpable lymphadenopathy in the cervical, axillary or inguinal LUNGS: clear to auscultation and percussion with normal breathing effort HEART: regular rate & rhythm and no murmurs and no lower extremity edema ABDOMEN:abdomen soft, non-tender  and normal bowel sounds Musculoskeletal:no cyanosis of digits and no clubbing  NEURO: alert & oriented x 3 with fluent speech, no focal motor/sensory deficits  LABORATORY DATA:  I have reviewed the data as listed   Chemistry      Component Value Date/Time   NA 142 08/31/2014 1323   K 3.3* 08/31/2014 1323   CO2 23 08/31/2014 1323   BUN 14.6 08/31/2014 1323   CREATININE 0.7 08/31/2014 1323      Component Value Date/Time   CALCIUM 9.5 08/31/2014 1323   ALKPHOS 57 08/31/2014 1323   AST 12 08/31/2014 1323   ALT 20 08/31/2014 1323   BILITOT 0.61 08/31/2014 1323       Lab Results  Component Value Date   WBC 10.1 08/31/2014   HGB 12.7 08/31/2014   HCT 37.5 08/31/2014   MCV 92.9 08/31/2014   PLT 308 08/31/2014   NEUTROABS 8.4* 08/31/2014   ASSESSMENT & PLAN:  Breast cancer of lower-outer quadrant of right female breast Right breast invasive ductal carcinoma, grade 2, 0.9 cm,  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  Treatment plan: Taxotere Cytoxan every 3 weeks 4 cycles Today is cycle 3 day 1. Counts have been reviewed  Chemo toxicities: 1. Difficulty with sleeping due to Decadron: She is to ontinue to decrease the dosage of Decadron to half tablet twice a day 2. Constipation: Improved with Colace ( previously patient had irritable bowel syndrome and was using Questran) 3. Bone pain due to Neulasta: Claritin-D helped her symptoms 4. Hypotension: She is off her blood pressure medication and her blood pressure has improved.  Monitoring closely for toxicities  RTC in 3 weeks for cycle 4 with repeat labs  Orders Placed This Encounter  Procedures  . CBC with Differential    Standing Status: Future     Number of Occurrences:      Standing Expiration Date: 08/31/2015  . Comprehensive metabolic panel (Cmet) - [facility]    Standing Status: Future     Number of Occurrences:      Standing Expiration Date: 08/31/2015   The patient has a good understanding of the overall plan. she agrees with it. she will call with any problems that may develop before the next visit here.   Carlton Adam, PA-C  Patient reviewed with Dr. Lindi Adie

## 2014-08-31 NOTE — Patient Instructions (Signed)
[facility] Discharge Instructions for Patients Receiving Chemotherapy  Today you received the following chemotherapy agents Taxotere and Cytoxan  To help prevent nausea and vomiting after your treatment, we encourage you to take your nausea medication Compazine 10 mg every 6 hours as needed.   If you develop nausea and vomiting that is not controlled by your nausea medication, call the clinic.   BELOW ARE SYMPTOMS THAT SHOULD BE REPORTED IMMEDIATELY:  *FEVER GREATER THAN 100.5 F  *CHILLS WITH OR WITHOUT FEVER  NAUSEA AND VOMITING THAT IS NOT CONTROLLED WITH YOUR NAUSEA MEDICATION  *UNUSUAL SHORTNESS OF BREATH  *UNUSUAL BRUISING OR BLEEDING  TENDERNESS IN MOUTH AND THROAT WITH OR WITHOUT PRESENCE OF ULCERS  *URINARY PROBLEMS  *BOWEL PROBLEMS  UNUSUAL RASH Items with * indicate a potential emergency and should be followed up as soon as possible.  Feel free to call the clinic you have any questions or concerns. The clinic phone number is [phone].  Please show the [facility] at check-in to the Emergency Department and triage nurse.

## 2014-09-04 NOTE — Patient Instructions (Signed)
Follow up in 3 weeks, prior to the start of your next scheduled cycle of chemotherapy 

## 2014-09-08 ENCOUNTER — Other Ambulatory Visit: Payer: Self-pay | Admitting: *Deleted

## 2014-09-08 ENCOUNTER — Other Ambulatory Visit: Payer: Self-pay | Admitting: Nurse Practitioner

## 2014-09-08 ENCOUNTER — Ambulatory Visit (HOSPITAL_BASED_OUTPATIENT_CLINIC_OR_DEPARTMENT_OTHER): Payer: BC Managed Care – PPO | Admitting: Nurse Practitioner

## 2014-09-08 DIAGNOSIS — L03114 Cellulitis of left upper limb: Secondary | ICD-10-CM

## 2014-09-08 DIAGNOSIS — C50511 Malignant neoplasm of lower-outer quadrant of right female breast: Secondary | ICD-10-CM

## 2014-09-08 MED ORDER — CEPHALEXIN 500 MG PO CAPS: 500.0000 mg | ORAL_CAPSULE | Freq: Four times a day (QID) | ORAL | Status: DC

## 2014-09-13 ENCOUNTER — Encounter: Payer: Self-pay | Admitting: Nurse Practitioner

## 2014-09-13 DIAGNOSIS — L0390 Cellulitis, unspecified: Secondary | ICD-10-CM | POA: Insufficient documentation

## 2014-09-13 NOTE — Assessment & Plan Note (Signed)
Pt last received taxotere/cytoxan chemo on 08/31/14.  Pt scheduled to return for labs, visit, and chemo again on 09/21/14.

## 2014-09-13 NOTE — Progress Notes (Signed)
SYMPTOM MANAGEMENT CLINIC   HPI: Tammy Day 64 y.o. female diagnosed with breast cancer.  Currently undergoing taxotere/cytoxan chemotherapy.   C/o left forearm distended veins and mild erythema to site of most recent peripheral chemo.  Pt denies any fever or chills.   HPI  ROS  Past Medical History  Diagnosis Date  . Pure hypercholesterolemia   . IBS (irritable bowel syndrome)   . GERD (gastroesophageal reflux disease)   . Hiatal hernia   . Ulcer   . Ovarian cyst   . Migraine   . Breast cancer of lower-outer quadrant of right female breast 05/04/2014  . Plantar fasciitis   . Cardiac abnormality     lazy T wave on EKG  . H/O oophorectomy   . Sinus congestion   . Unspecified essential hypertension     Past Surgical History  Procedure Laterality Date  . Appendectomy    . Tubal ligation Bilateral 2009  . Breast cyst excision Right 1998  . Esophagogastroduodenoscopy  2002  . Nasal sinus surgery  2000, 2007  . Colonoscopy    . Abdominal hysterectomy  1992  . Bilateral oophorectomy  2009  . Radioactive seed guided mastectomy with axillary sentinel lymph node biopsy Right 06/24/2014    Procedure: RADIOACTIVE SEED GUIDED PARTIAL MASTECTOMY WITH AXILLARY SENTINEL LYMPH NODE BIOPSY;  Surgeon: Autumn Messing III, MD;  Location: [facility];  Service: General;  Laterality: Right;    has Chest pain; Elevated lipids; HTN (hypertension); Aortic root dilatation; Breast cancer of lower-outer quadrant of right female breast; and Cellulitis on her problem list.    is allergic to atorvastatin; nexium; other; pravastatin; prilosec; and rabeprazole.    Medication List       This list is accurate as of: 09/08/14 11:59 PM.  Always use your most recent med list.               Ascorbic Acid 125 MG Chew  Chew by mouth.     aspirin 81 MG tablet  Take 81 mg by mouth daily.     azelastine 0.1 % nasal spray  Commonly known as:  ASTELIN  Place into both nostrils 2  (two) times daily. Use in each nostril as directed     CALCIUM/D3 ADULT GUMMIES PO  Take by mouth.     cephALEXin 500 MG capsule  Commonly known as:  KEFLEX  Take 1 capsule (500 mg total) by mouth 4 (four) times daily.     cholestyramine 4 GM/DOSE powder  Commonly known as:  QUESTRAN  Take by mouth daily.     COLACE PO  Take 1 capsule by mouth as needed.     colesevelam 625 MG tablet  Commonly known as:  WELCHOL  Take 625 mg by mouth daily. 1 tab in the am, 2 in the pm.     dexamethasone 4 MG tablet  Commonly known as:  DECADRON  Take 1 tablet (4 mg total) by mouth 2 (two) times daily. Start the day before Taxotere. Then again the day after chemo for 3 days.     diphenhydrAMINE 25 MG tablet  Commonly known as:  BENADRYL  Take 25 mg by mouth every 6 (six) hours as needed.     Fish Oil 1200 MG Caps  Take by mouth daily.     fluticasone 50 MCG/ACT nasal spray  Commonly known as:  FLONASE     hydrocortisone cream 0.5 %  Apply 1 application topically 2 (two) times daily.  loperamide 2 MG capsule  Commonly known as:  IMODIUM  Take by mouth as needed for diarrhea or loose stools.     LORazepam 0.5 MG tablet  Commonly known as:  ATIVAN  Take 1 tablet (0.5 mg total) by mouth at bedtime.     montelukast 10 MG tablet  Commonly known as:  SINGULAIR  Take 10 mg by mouth at bedtime.     ondansetron 8 MG tablet  Commonly known as:  ZOFRAN  Take 1 tablet (8 mg total) by mouth 2 (two) times daily. Start the day after chemo for 3 days. Then take as needed for nausea or vomiting.     ONE-A-DAY VITACRAVES PO  Take by mouth daily.     oxyCODONE-acetaminophen 5-325 MG per tablet  Commonly known as:  ROXICET  Take 1-2 tablets by mouth every 4 (four) hours as needed.     prochlorperazine 10 MG tablet  Commonly known as:  COMPAZINE  Take 1 tablet (10 mg total) by mouth every 6 (six) hours as needed (Nausea or vomiting).     RABEprazole 20 MG tablet  Commonly known as:   ACIPHEX  Take 20 mg by mouth as needed.     telmisartan-hydrochlorothiazide 80-25 MG per tablet  Commonly known as:  MICARDIS HCT  Take by mouth daily. 1/2 tab daily     triamcinolone cream 0.1 %  Commonly known as:  KENALOG  Apply 1 application topically 2 (two) times daily. As needed     UNABLE TO FIND  1 each by Other route as needed. Dispense per medical necessity cranial prothesis due to alopecia induced by chemotherapy for breast cancer diagnosis     UNABLE TO FIND  1 each by Other route once. Dispense cranial prosthesis per medical necessity due to chemotherapy secondary to known breast cancer diagnosis         PHYSICAL EXAMINATION  Oncology Vitals 08/31/2014 08/17/2014 08/10/2014 07/27/2014 07/20/2014 07/20/2014 07/20/2014  Height 165 cm - 165 cm 165 cm - - -  Weight 76.023 kg 77.31 kg 75.524 kg 74.571 kg - - -  Weight (lbs) 167 lbs 10 oz 170 lbs 7 oz 166 lbs 8 oz 164 lbs 6 oz - - -  BMI (kg/m2) 27.89 kg/m2 - 27.71 kg/m2 27.36 kg/m2 - - -  Temp 97.8 98 99.4 97.5 97.6 97 97  Pulse 94 84 90 89 67 66 66  Resp 17 - 18 18 - 16 18  SpO2 97 - 93 98 - 98 97  BSA (m2) 1.87 m2 - 1.86 m2 1.85 m2 - - -   BP Readings from Last 3 Encounters:  08/31/14 132/68  08/17/14 119/72  08/10/14 133/86    Physical Exam  Constitutional: She is oriented to person, place, and time and well-developed, well-nourished, and in no distress.  HENT:  Head: Normocephalic and atraumatic.  Eyes: Conjunctivae and EOM are normal. Pupils are equal, round, and reactive to light. Right eye exhibits no discharge. Left eye exhibits no discharge. No scleral icterus.  Neck: Normal range of motion.  Pulmonary/Chest: Effort normal. No respiratory distress.  Musculoskeletal: Normal range of motion. She exhibits no edema or tenderness.  Neurological: She is alert and oriented to person, place, and time.  Skin: Skin is warm and dry. No rash noted. There is erythema. No pallor.  On exam- Left forearm with mild erythema  and trace "corded vein"- but no warmth, edema,tenderness, or red streaks.     Psychiatric: Affect normal.  Nursing note and  vitals reviewed.   LABORATORY DATA:. No visits with results within 3 Day(s) from this visit. Latest known visit with results is:  Appointment on 08/31/2014  Component Date Value Ref Range Status  . WBC 08/31/2014 10.1  3.9 - 10.3 10e3/uL Final  . NEUT# 08/31/2014 8.4* 1.5 - 6.5 10e3/uL Final  . HGB 08/31/2014 12.7  11.6 - 15.9 g/dL Final  . HCT 08/31/2014 37.5  34.8 - 46.6 % Final  . Platelets 08/31/2014 308  145 - 400 10e3/uL Final  . MCV 08/31/2014 92.9  79.5 - 101.0 fL Final  . MCH 08/31/2014 31.4  25.1 - 34.0 pg Final  . MCHC 08/31/2014 33.8  31.5 - 36.0 g/dL Final  . RBC 08/31/2014 4.04  3.70 - 5.45 10e6/uL Final  . RDW 08/31/2014 14.9* 11.2 - 14.5 % Final  . lymph# 08/31/2014 1.1  0.9 - 3.3 10e3/uL Final  . MONO# 08/31/2014 0.6  0.1 - 0.9 10e3/uL Final  . Eosinophils Absolute 08/31/2014 0.0  0.0 - 0.5 10e3/uL Final  . Basophils Absolute 08/31/2014 0.0  0.0 - 0.1 10e3/uL Final  . NEUT% 08/31/2014 83.4* 38.4 - 76.8 % Final  . LYMPH% 08/31/2014 10.4* 14.0 - 49.7 % Final  . MONO% 08/31/2014 5.8  0.0 - 14.0 % Final  . EOS% 08/31/2014 0.0  0.0 - 7.0 % Final  . BASO% 08/31/2014 0.4  0.0 - 2.0 % Final  . Sodium 08/31/2014 142  136 - 145 mEq/L Final  . Potassium 08/31/2014 3.3* 3.5 - 5.1 mEq/L Final  . Chloride 08/31/2014 110* 98 - 109 mEq/L Final  . CO2 08/31/2014 23  22 - 29 mEq/L Final  . Glucose 08/31/2014 146* 70 - 140 mg/dl Final  . BUN 08/31/2014 14.6  7.0 - 26.0 mg/dL Final  . Creatinine 08/31/2014 0.7  0.6 - 1.1 mg/dL Final  . Total Bilirubin 08/31/2014 0.61  0.20 - 1.20 mg/dL Final  . Alkaline Phosphatase 08/31/2014 57  40 - 150 U/L Final  . AST 08/31/2014 12  5 - 34 U/L Final  . ALT 08/31/2014 20  0 - 55 U/L Final  . Total Protein 08/31/2014 7.1  6.4 - 8.3 g/dL Final  . Albumin 08/31/2014 3.8  3.5 - 5.0 g/dL Final  . Calcium 08/31/2014 9.5   8.4 - 10.4 mg/dL Final  . Anion Gap 08/31/2014 9  3 - 11 mEq/L Final  . EGFR 08/31/2014 86* >90 ml/min/1.73 m2 Final   eGFR is calculated using the CKD-EPI Creatinine Equation (2009)     RADIOGRAPHIC STUDIES: No results found.  ASSESSMENT/PLAN:    Breast cancer of lower-outer quadrant of right female breast Pt last received taxotere/cytoxan chemo on 08/31/14.  Pt scheduled to return for labs, visit, and chemo again on 09/21/14.  Cellulitis C/o left forearm distended veins and mild erythema to site of most recent peripheral chemo.  Pt denies any fever or chills.   On exam- Left forearm with mild erythema and trace "corded vein"- but no warmth, tenderness, or red streaks.   Advised pt that left forearm most likely mild phlebitis post chemotherapy; but will also treat for any possible cellulitis as well with Keflex abx.  Advised that pt apply moist cool compresses to site; and to try occasional Ibuprofen to help with inflammation.   Advised pt to call/return or go directly to ED for any persistent or worsening symptoms.   Patient stated understanding of all instructions; and was in agreement with this plan of care. The patient knows to call the clinic  with any problems, questions or concerns.   Review/collaboration with Dr. Lindi Adie regarding all aspects of patient's visit today.   Total time spent with patient was 25 minutes;  with greater than 75 percent of that time spent in face to face counseling regarding patient's symptoms,  and coordination of care and follow up.  Disclaimer: This note was dictated with voice recognition software. Similar sounding words can inadvertently be transcribed and may not be corrected upon review.   Drue Second, NP 09/13/2014

## 2014-09-13 NOTE — Assessment & Plan Note (Signed)
C/o left forearm distended veins and mild erythema to site of most recent peripheral chemo.  Pt denies any fever or chills.   On exam- Left forearm with mild erythema and trace "corded vein"- but no warmth, tenderness, or red streaks.   Advised pt that left forearm most likely mild phlebitis post chemotherapy; but will also treat for any possible cellulitis as well with Keflex abx.  Advised that pt apply moist cool compresses to site; and to try occasional Ibuprofen to help with inflammation.   Advised pt to call/return or go directly to ED for any persistent or worsening symptoms.

## 2014-09-16 ENCOUNTER — Telehealth: Payer: Self-pay | Admitting: *Deleted

## 2014-09-16 NOTE — Telephone Encounter (Signed)
TC to pt to check on status since left arm swelling. LM for rtn call if any questions or concerns.

## 2014-09-21 ENCOUNTER — Ambulatory Visit (HOSPITAL_BASED_OUTPATIENT_CLINIC_OR_DEPARTMENT_OTHER): Payer: Medicare Other

## 2014-09-21 ENCOUNTER — Telehealth: Payer: Self-pay | Admitting: Hematology and Oncology

## 2014-09-21 ENCOUNTER — Ambulatory Visit (HOSPITAL_BASED_OUTPATIENT_CLINIC_OR_DEPARTMENT_OTHER): Payer: Medicare Other | Admitting: Hematology and Oncology

## 2014-09-21 ENCOUNTER — Other Ambulatory Visit (HOSPITAL_BASED_OUTPATIENT_CLINIC_OR_DEPARTMENT_OTHER): Payer: Medicare Other

## 2014-09-21 ENCOUNTER — Encounter: Payer: Self-pay | Admitting: *Deleted

## 2014-09-21 ENCOUNTER — Encounter: Payer: Self-pay | Admitting: Hematology and Oncology

## 2014-09-21 VITALS — BP 124/73 | HR 76 | Temp 98.1°F | Resp 18 | Ht 65.0 in | Wt 170.7 lb

## 2014-09-21 DIAGNOSIS — C50511 Malignant neoplasm of lower-outer quadrant of right female breast: Secondary | ICD-10-CM

## 2014-09-21 DIAGNOSIS — Z5111 Encounter for antineoplastic chemotherapy: Secondary | ICD-10-CM

## 2014-09-21 LAB — CBC WITH DIFFERENTIAL/PLATELET
BASO%: 0.3 % (ref 0.0–2.0)
Basophils Absolute: 0 10*3/uL (ref 0.0–0.1)
EOS ABS: 0 10*3/uL (ref 0.0–0.5)
EOS%: 0.1 % (ref 0.0–7.0)
HEMATOCRIT: 31.2 % — AB (ref 34.8–46.6)
HGB: 10.6 g/dL — ABNORMAL LOW (ref 11.6–15.9)
LYMPH#: 0.9 10*3/uL (ref 0.9–3.3)
LYMPH%: 12.2 % — AB (ref 14.0–49.7)
MCH: 31.7 pg (ref 25.1–34.0)
MCHC: 33.9 g/dL (ref 31.5–36.0)
MCV: 93.4 fL (ref 79.5–101.0)
MONO#: 0.6 10*3/uL (ref 0.1–0.9)
MONO%: 8.3 % (ref 0.0–14.0)
NEUT#: 6.1 10*3/uL (ref 1.5–6.5)
NEUT%: 79.1 % — ABNORMAL HIGH (ref 38.4–76.8)
PLATELETS: 249 10*3/uL (ref 145–400)
RBC: 3.34 10*6/uL — ABNORMAL LOW (ref 3.70–5.45)
RDW: 16.3 % — AB (ref 11.2–14.5)
WBC: 7.7 10*3/uL (ref 3.9–10.3)

## 2014-09-21 LAB — COMPREHENSIVE METABOLIC PANEL (CC13)
ALT: 18 U/L (ref 0–55)
ANION GAP: 8 meq/L (ref 3–11)
AST: 11 U/L (ref 5–34)
Albumin: 3.5 g/dL (ref 3.5–5.0)
Alkaline Phosphatase: 50 U/L (ref 40–150)
BUN: 13.9 mg/dL (ref 7.0–26.0)
CALCIUM: 9.2 mg/dL (ref 8.4–10.4)
CO2: 21 mEq/L — ABNORMAL LOW (ref 22–29)
CREATININE: 0.7 mg/dL (ref 0.6–1.1)
Chloride: 112 mEq/L — ABNORMAL HIGH (ref 98–109)
EGFR: 86 mL/min/{1.73_m2} — ABNORMAL LOW (ref >90)
Glucose: 97 mg/dl (ref 70–140)
Potassium: 3.7 mEq/L (ref 3.5–5.1)
Sodium: 141 mEq/L (ref 136–145)
TOTAL PROTEIN: 6.3 g/dL — AB (ref 6.4–8.3)
Total Bilirubin: 0.45 mg/dL (ref 0.20–1.20)

## 2014-09-21 MED ORDER — PEGFILGRASTIM 6 MG/0.6ML ~~LOC~~ PSKT
6.0000 mg | PREFILLED_SYRINGE | Freq: Once | SUBCUTANEOUS | Status: AC
Start: 2014-09-21 — End: 2014-09-21
  Administered 2014-09-21: 6 mg via SUBCUTANEOUS
  Filled 2014-09-21: qty 0.6

## 2014-09-21 MED ORDER — DOCETAXEL CHEMO INJECTION 160 MG/16ML
75.0000 mg/m2 | Freq: Once | INTRAVENOUS | Status: AC
  Administered 2014-09-21: 140 mg via INTRAVENOUS
  Filled 2014-09-21: qty 14

## 2014-09-21 MED ORDER — HEPARIN SOD (PORK) LOCK FLUSH 100 UNIT/ML IV SOLN
500.0000 [IU] | Freq: Once | INTRAVENOUS | Status: DC | PRN
  Filled 2014-09-21: qty 5

## 2014-09-21 MED ORDER — PALONOSETRON HCL INJECTION 0.25 MG/5ML
INTRAVENOUS | Status: AC
  Filled 2014-09-21: qty 5

## 2014-09-21 MED ORDER — SODIUM CHLORIDE 0.9 % IV SOLN
Freq: Once | INTRAVENOUS | Status: AC
  Administered 2014-09-21: 12:00:00 via INTRAVENOUS
  Filled 2014-09-21: qty 1

## 2014-09-21 MED ORDER — SODIUM CHLORIDE 0.9 % IV SOLN
Freq: Once | INTRAVENOUS | Status: AC
  Administered 2014-09-21: 12:00:00 via INTRAVENOUS

## 2014-09-21 MED ORDER — PALONOSETRON HCL INJECTION 0.25 MG/5ML
0.2500 mg | Freq: Once | INTRAVENOUS | Status: AC
  Administered 2014-09-21: 0.25 mg via INTRAVENOUS

## 2014-09-21 MED ORDER — SODIUM CHLORIDE 0.9 % IV SOLN
600.0000 mg/m2 | Freq: Once | INTRAVENOUS | Status: AC
  Administered 2014-09-21: 1120 mg via INTRAVENOUS
  Filled 2014-09-21: qty 56

## 2014-09-21 MED ORDER — SODIUM CHLORIDE 0.9 % IJ SOLN
10.0000 mL | INTRAMUSCULAR | Status: DC | PRN
  Filled 2014-09-21: qty 10

## 2014-09-21 NOTE — Assessment & Plan Note (Addendum)
Right breast invasive ductal carcinoma, grade 2, 0.9 cm, 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  Treatment plan: Taxotere Cytoxan every 3 weeks 4 cycles Today is cycle 4 day 1. Counts have been reviewed  Chemo toxicities: 1. Difficulty with sleeping due to Decadron: She was reminded to decrease the dosage of Decadron to half tablet twice a day 2. Constipation: Improved with Colace ( previously patient had irritable bowel syndrome and was using Questran) 3. Vaginal yeast infection: Prescribed oral Diflucan for 3 days. Resolved. 4. Bone pain due to Neulasta: Claritin-D helped her symptoms 5. Hypotension: She is off her blood pressure medication and her blood pressure has improved.  Patient has retired from her job.  Plan: 1. Patient finishes adjuvant chemotherapy today. 2. She will be referred to radiation oncology for adjuvant radiation. 3. Once radiation is complete we will start antiestrogen therapy with anastrozole 1 mg daily 5 years  Return to clinic in 2 months for follow-up with labs.

## 2014-09-21 NOTE — Progress Notes (Signed)
Patient Care Team: L.Donnie Coffin, MD as PCP - General (Family Medicine) Autumn Messing III, MD as Consulting Physician (General Surgery) Nicholas Lose, MD as Consulting Physician (Hematology and Oncology) Arloa Koh, MD as Consulting Physician (Radiation Oncology) Rockwell Germany, RN as Registered Nurse Mauro Kaufmann, RN as Registered Nurse Holley Bouche, NP as Nurse Practitioner (Nurse Practitioner)  DIAGNOSIS: Breast cancer of lower-outer quadrant of right female breast   Staging form: Breast, AJCC 7th Edition     Clinical stage from 05/12/2014: Stage IA (T1b, N0, M0) - Unsigned       Staging comments: Staged at breast conference 3.2.16      Pathologic stage from 06/25/2014: Stage IA (T1b, N0, cM0) - Signed by Enid Cutter, MD on 07/21/2014       Staging comments: Staged on final lumpectomy specimen by Dr. Lyndon Code    SUMMARY OF ONCOLOGIC HISTORY:   Breast cancer of lower-outer quadrant of right female breast   04/30/2014 Initial Diagnosis Right breast biopsy 6:00: Invasive mammary cancer favor ductal, grade 2, ER 96%, PR 45%, Ki-67 49%, HER-2 negative ratio 1.03   05/11/2014 Breast MRI Right breast 6:00 position: 0.9 x 0.7 x 0.6 cm mass, right breast 1 cm T2 hyperintense enhancing mass suggestive of benign fibroadenoma which has been stable   06/24/2014 Surgery Right lumpectomy: IDC Grade 2, 0.9 cm, 3 SLN Neg T1bN0 (Stage 1A) with IG DCIS, Oncotype DX recurrence score 28, 18% risk of recurrence   07/20/2014 -  Chemotherapy Taxotere Cytoxan Q 3 weeks X 4    CHIEF COMPLIANT: cycle 4 Taxotere Cytoxan  INTERVAL HISTORY: Tammy Day is a 64 year old with above-mentioned history of right breast cancer currently on adjuvant chemotherapy with Taxotere and Cytoxan. She has had problems with her wanes in terms of tolerability. She had some bruising and required antibiotics treatment for suspected cellulitis. But it has resolved. She had some scaling on her hands from desquamation. She applied  cortisone cream and her symptoms resolved. Denies any nausea vomiting. She has excellent appetite.  REVIEW OF SYSTEMS:   Constitutional: Denies fevers, chills or abnormal weight loss Eyes: Denies blurriness of vision Ears, nose, mouth, throat, and face: Denies mucositis or sore throat Respiratory: Denies cough, dyspnea or wheezes Cardiovascular: Denies palpitation, chest discomfort or lower extremity swelling Gastrointestinal:  Denies nausea, heartburn or change in bowel habits Skin: desquamation of the palms Lymphatics: Denies new lymphadenopathy or easy bruising Neurological:Denies numbness, tingling or new weaknesses Behavioral/Psych: Mood is stable, no new changes  All other systems were reviewed with the patient and are negative.  I have reviewed the past medical history, past surgical history, social history and family history with the patient and they are unchanged from previous note.  ALLERGIES:  is allergic to atorvastatin; nexium; other; pravastatin; prilosec; and rabeprazole.  MEDICATIONS:  Current Outpatient Prescriptions  Medication Sig Dispense Refill  . Ascorbic Acid 125 MG CHEW Chew by mouth.    Marland Kitchen aspirin 81 MG tablet Take 81 mg by mouth daily.    Marland Kitchen azelastine (ASTELIN) 0.1 % nasal spray Place into both nostrils 2 (two) times daily. Use in each nostril as directed    . Calcium-Phosphorus-Vitamin D (CALCIUM/D3 ADULT GUMMIES PO) Take by mouth.    . cephALEXin (KEFLEX) 500 MG capsule Take 1 capsule (500 mg total) by mouth 4 (four) times daily. 40 capsule 0  . cholestyramine (QUESTRAN) 4 GM/DOSE powder Take by mouth daily.    . colesevelam (WELCHOL) 625 MG tablet Take 625 mg by  mouth daily. 1 tab in the am, 2 in the pm.    . dexamethasone (DECADRON) 4 MG tablet Take 1 tablet (4 mg total) by mouth 2 (two) times daily. Start the day before Taxotere. Then again the day after chemo for 3 days. 30 tablet 1  . diphenhydrAMINE (BENADRYL) 25 MG tablet Take 25 mg by mouth every 6  (six) hours as needed.    Mariane Baumgarten Sodium (COLACE PO) Take 1 capsule by mouth as needed.    . fluticasone (FLONASE) 50 MCG/ACT nasal spray   1  . hydrocortisone cream 0.5 % Apply 1 application topically 2 (two) times daily.    Marland Kitchen loperamide (IMODIUM) 2 MG capsule Take by mouth as needed for diarrhea or loose stools.    Marland Kitchen LORazepam (ATIVAN) 0.5 MG tablet Take 1 tablet (0.5 mg total) by mouth at bedtime. 30 tablet 0  . montelukast (SINGULAIR) 10 MG tablet Take 10 mg by mouth at bedtime.    . Multiple Vitamins-Minerals (ONE-A-DAY VITACRAVES PO) Take by mouth daily.    . Omega-3 Fatty Acids (FISH OIL) 1200 MG CAPS Take by mouth daily.    . ondansetron (ZOFRAN) 8 MG tablet Take 1 tablet (8 mg total) by mouth 2 (two) times daily. Start the day after chemo for 3 days. Then take as needed for nausea or vomiting. 30 tablet 1  . oxyCODONE-acetaminophen (ROXICET) 5-325 MG per tablet Take 1-2 tablets by mouth every 4 (four) hours as needed. 50 tablet 0  . prochlorperazine (COMPAZINE) 10 MG tablet Take 1 tablet (10 mg total) by mouth every 6 (six) hours as needed (Nausea or vomiting). 30 tablet 1  . RABEprazole (ACIPHEX) 20 MG tablet Take 20 mg by mouth as needed.     Marland Kitchen telmisartan-hydrochlorothiazide (MICARDIS HCT) 80-25 MG per tablet Take by mouth daily. 1/2 tab daily    . triamcinolone cream (KENALOG) 0.1 % Apply 1 application topically 2 (two) times daily. As needed    . UNABLE TO FIND 1 each by Other route as needed. 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 UNABLE TO FIND 1 each by Other route once. Dispense cranial prosthesis per medical necessity due to chemotherapy secondary to known breast cancer diagnosis 1 each 0   No current facility-administered medications for this visit.    PHYSICAL EXAMINATION: ECOG PERFORMANCE STATUS: 1 - Symptomatic but completely ambulatory  Filed Vitals:   09/21/14 1110  BP:  124/73  Pulse: 76  Temp: 98.1 F (36.7 C)  Resp: 18   Filed Weights   09/21/14 1110  Weight: 170 lb 11.2 oz (77.429 kg)    GENERAL:alert, no distress and comfortable SKIN: skin color, texture, turgor are normal, no rashes or significant lesions EYES: normal, Conjunctiva are pink and non-injected, sclera clear OROPHARYNX:no exudate, no erythema and lips, buccal mucosa, and tongue normal  NECK: supple, thyroid normal size, non-tender, without nodularity LYMPH:  no palpable lymphadenopathy in the cervical, axillary or inguinal LUNGS: clear to auscultation and percussion with normal breathing effort HEART: regular rate & rhythm and no murmurs and no lower extremity edema ABDOMEN:abdomen soft, non-tender and normal bowel sounds Musculoskeletal:no cyanosis of digits and no clubbing  NEURO: alert & oriented x 3 with fluent speech, no focal motor/sensory deficits   LABORATORY DATA:  I have reviewed the data as listed   Chemistry      Component Value Date/Time   NA 141 09/21/2014 1038  K 3.7 09/21/2014 1038   CO2 21* 09/21/2014 1038   BUN 13.9 09/21/2014 1038   CREATININE 0.7 09/21/2014 1038      Component Value Date/Time   CALCIUM 9.2 09/21/2014 1038   ALKPHOS 50 09/21/2014 1038   AST 11 09/21/2014 1038   ALT 18 09/21/2014 1038   BILITOT 0.45 09/21/2014 1038       Lab Results  Component Value Date   WBC 7.7 09/21/2014   HGB 10.6* 09/21/2014   HCT 31.2* 09/21/2014   MCV 93.4 09/21/2014   PLT 249 09/21/2014   NEUTROABS 6.1 09/21/2014   ASSESSMENT & PLAN:  Breast cancer of lower-outer quadrant of right female breast Right breast invasive ductal carcinoma, grade 2, 0.9 cm, 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  Treatment plan: Taxotere Cytoxan every 3 weeks 4 cycles Today is cycle 4 day 1. Counts have been reviewed  Chemo  toxicities: 1. Difficulty with sleeping due to Decadron: She was reminded to decrease the dosage of Decadron to half tablet twice a day 2. Constipation: Improved with Colace ( previously patient had irritable bowel syndrome and was using Questran) 3. Vaginal yeast infection: Prescribed oral Diflucan for 3 days. Resolved. 4. Bone pain due to Neulasta: Claritin-D helped her symptoms 5. Hypotension: She is off her blood pressure medication and her blood pressure has improved. 6. Desquamation palms: Resolved with cortisone cream  Patient has retired from her job.  Plan: 1. Patient finishes adjuvant chemotherapy today. 2. She will be referred to radiation oncology for adjuvant radiation. 3. Once radiation is complete we will start antiestrogen therapy with anastrozole 1 mg daily 5 years  Return to clinic in 2 months for follow-up with labs.   No orders of the defined types were placed in this encounter.   The patient has a good understanding of the overall plan. she agrees with it. she will call with any problems that may develop before the next visit here.   Rulon Eisenmenger, MD

## 2014-09-21 NOTE — Progress Notes (Unsigned)
Met with pt during final chemo. Relate doing well and without complaints. Discussed yoga and tai chi as well as other support services.  Encourage pt to call with needs or questions. Received verbal understanding.

## 2014-09-21 NOTE — Telephone Encounter (Signed)
Gave avs & calendar for September °

## 2014-09-21 NOTE — Patient Instructions (Signed)
[facility] Discharge Instructions for Patients Receiving Chemotherapy  Today you received the following chemotherapy agents:  Taxotere and Cytoxan  To help prevent nausea and vomiting after your treatment, we encourage you to take your nausea medication as ordered per MD.   If you develop nausea and vomiting that is not controlled by your nausea medication, call the clinic.   BELOW ARE SYMPTOMS THAT SHOULD BE REPORTED IMMEDIATELY:  *FEVER GREATER THAN 100.5 F  *CHILLS WITH OR WITHOUT FEVER  NAUSEA AND VOMITING THAT IS NOT CONTROLLED WITH YOUR NAUSEA MEDICATION  *UNUSUAL SHORTNESS OF BREATH  *UNUSUAL BRUISING OR BLEEDING  TENDERNESS IN MOUTH AND THROAT WITH OR WITHOUT PRESENCE OF ULCERS  *URINARY PROBLEMS  *BOWEL PROBLEMS  UNUSUAL RASH Items with * indicate a potential emergency and should be followed up as soon as possible.  Feel free to call the clinic you have any questions or concerns. The clinic phone number is [phone].  Please show the [facility] at check-in to the Emergency Department and triage nurse.

## 2014-10-11 ENCOUNTER — Ambulatory Visit
Admission: RE | Admit: 2014-10-11 | Discharge: 2014-10-11 | Disposition: A | Discharge status: Home / Self Care | Payer: Medicare Other | Source: Ambulatory Visit | Attending: Radiation Oncology | Admitting: Radiation Oncology

## 2014-10-11 ENCOUNTER — Encounter: Payer: Self-pay | Admitting: Radiation Oncology

## 2014-10-11 ENCOUNTER — Inpatient Hospital Stay
Admission: RE | Admit: 2014-10-11 | Discharge: 2014-10-11 | Disposition: A | Discharge status: Home / Self Care | Payer: BC Managed Care – PPO | Source: Ambulatory Visit | Attending: Radiation Oncology | Admitting: Radiation Oncology

## 2014-10-11 DIAGNOSIS — C50511 Malignant neoplasm of lower-outer quadrant of right female breast: Secondary | ICD-10-CM | POA: Insufficient documentation

## 2014-10-11 NOTE — Progress Notes (Signed)
Ms. Tammy Day has completed 4 planned cycles of Taxotere regimen as of 09/21/14.

## 2014-10-13 NOTE — Progress Notes (Addendum)
Completed 4 planned cycles of Taxotere. Once radiation is complete we will start antiestrogen therapy with anastrozole 1 mg daily 5 years.  Denies any pain today.     Blood pressure 110/70, pulse 86, temperature 97.9 F (36.6 C), height 5\' 5"  (1.651 m), weight 172 lb 3.2 oz (78.109 kg).   Wt Readings from Last 3 Encounters:  10/15/14 172 lb 3.2 oz (78.109 kg)  10/11/14 172 lb 9.6 oz (78.291 kg)  09/21/14 170 lb 11.2 oz (77.429 kg)

## 2014-10-14 ENCOUNTER — Ambulatory Visit: Payer: Medicare Other

## 2014-10-14 ENCOUNTER — Ambulatory Visit: Payer: Medicare Other | Admitting: Radiation Oncology

## 2014-10-15 ENCOUNTER — Ambulatory Visit
Admission: RE | Admit: 2014-10-15 | Discharge: 2014-10-15 | Disposition: A | Discharge status: Home / Self Care | Payer: Medicare Other | Source: Ambulatory Visit | Attending: Radiation Oncology | Admitting: Radiation Oncology

## 2014-10-15 ENCOUNTER — Encounter: Payer: Self-pay | Admitting: Radiation Oncology

## 2014-10-15 VITALS — BP 110/70 | HR 86 | Temp 97.9°F | Ht 65.0 in | Wt 172.2 lb

## 2014-10-15 DIAGNOSIS — C50511 Malignant neoplasm of lower-outer quadrant of right female breast: Secondary | ICD-10-CM

## 2014-10-15 NOTE — Progress Notes (Signed)
CC: Dr. Donnie Coffin, Dr. 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  I first saw the patient at the breast multidisciplinary clinic on 05/12/2014. At the time of a screening mammogram on 04/05/2014 she was felt to have a possible right breast mass. Additional views on February 8 showed an irregular mass at 6 o'clock which on ultrasound measured 0.9 x 0.6 x 0.6 cm. Ultrasound of the right axilla was negative. On 04/30/2014 showed underwent a right breast core biopsy with the diagnosis of invasive ductal carcinoma, grade 2. Her tumor was ER positive at 96, PR positive at 45 with an elevated Ki-67 of 49. HER-2/neu negative. MRI scan showed a solitary 0.9 cm right breast primary at 6:00.She underwent a right partial mastectomy and sentinel lymph node biopsy on 06/24/2014.  She was found to have a grade 20.9 cm invasive ductal carcinoma along with DCIS.  Surgical margins were at least 0.2 cm for both invasive and noninvasive disease.  3 sentinel lymph nodes were free of metastatic disease.  Her Oncotype type DX score was 28, therefore she underwent adjuvant chemotherapy with Dr. Lindi Adie.  She received 4 cycles of TC chemotherapy.  She completed her chemotherapy on July 12.  She is without complaints today.  Physical examination: Alert and oriented. Filed Vitals:   10/15/14 0800  BP: 110/70  Pulse: 86  Temp: 97.9 F (36.6 C)   Head and neck examination: She has alopecia.  Nodes: Without palpable cervical, supraclavicular, or axillary lymphadenopathy.  Breasts: There is a partial mastectomy scar along the inferior aspect of the right breast just anterior to the inframammary fold.  This is centered at 6:00.  No masses are appreciated.  Left  breast without masses or lesions.  Extremities: Without edema.  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  We again discussed the planning process along with the potential acute and late toxicities of radiation therapy.  Since she had adjuvant chemotherapy she received standard fractionation.  I plan on giving her 5000 cGy in 25 sessions, no boost.  She will return for CT simulation on Monday, August 15 and begin her radiation therapy the following week.  Consent is signed today.  Plan: As above.  30 minutes was spent face-to-face with the patient, primarily counseling patient and coordinating her care.

## 2014-10-15 NOTE — Addendum Note (Signed)
Encounter addended by: Benn Moulder, RN on: 10/15/2014 12:00 PM<BR>     Documentation filed: Inpatient Document Flowsheet, Flowsheet VN, BPA Follow-up Actions, Charges VN

## 2014-10-20 DIAGNOSIS — C50511 Malignant neoplasm of lower-outer quadrant of right female breast: Secondary | ICD-10-CM | POA: Diagnosis not present

## 2014-10-21 DIAGNOSIS — N951 Menopausal and female climacteric states: Secondary | ICD-10-CM | POA: Diagnosis not present

## 2014-10-21 DIAGNOSIS — K589 Irritable bowel syndrome without diarrhea: Secondary | ICD-10-CM | POA: Diagnosis not present

## 2014-10-21 DIAGNOSIS — K219 Gastro-esophageal reflux disease without esophagitis: Secondary | ICD-10-CM | POA: Diagnosis not present

## 2014-10-21 DIAGNOSIS — E780 Pure hypercholesterolemia: Secondary | ICD-10-CM | POA: Diagnosis not present

## 2014-10-21 DIAGNOSIS — Z0000 Encounter for general adult medical examination without abnormal findings: Secondary | ICD-10-CM | POA: Diagnosis not present

## 2014-10-21 DIAGNOSIS — I10 Essential (primary) hypertension: Secondary | ICD-10-CM | POA: Diagnosis not present

## 2014-10-25 ENCOUNTER — Ambulatory Visit
Admission: RE | Admit: 2014-10-25 | Discharge: 2014-10-25 | Disposition: A | Discharge status: Home / Self Care | Payer: Medicare Other | Source: Ambulatory Visit | Attending: Radiation Oncology | Admitting: Radiation Oncology

## 2014-10-25 DIAGNOSIS — C50511 Malignant neoplasm of lower-outer quadrant of right female breast: Secondary | ICD-10-CM | POA: Diagnosis not present

## 2014-10-25 NOTE — Progress Notes (Signed)
Complex simulation/treatment planning note: The patient was taken to the CT simulator.  A Vac lock immobilization device was constructed on a custom breast board.  Her right breast tangent borders were marked with radiopaque wires along with her inferior partial mastectomy scar.  She was then scanned.  The CT data set was sent to the planning system where contoured her tumor bed.  Dosimetry contoured her remaining normal anatomy including her lungs and heart.  She was set up to medial and lateral right breast tangents with 2 unique MLCs.  She is now ready for 3-D simulation.  I am prescribing 5000 cGy in 25 sessions.  No boost.

## 2014-10-26 ENCOUNTER — Encounter: Payer: Self-pay | Admitting: Radiation Oncology

## 2014-10-26 DIAGNOSIS — C50511 Malignant neoplasm of lower-outer quadrant of right female breast: Secondary | ICD-10-CM | POA: Diagnosis not present

## 2014-10-26 NOTE — Progress Notes (Signed)
3-D simulation note: The patient completed 3-D simulation for treatment to her right breast.  She was set up to medial and lateral right breast tangents.  Dose volume histograms were obtained for the target tumor bed and also avoidance structures including the heart and lungs.  We met our departmental guidelines.  She was set up with a unique [facility] for each field, medial and lateral and also two electronic compensation fields for a total for complex treatment devices.  I prescribing 5000 cGy in 25 sessions utilizing 6 MV photons.  No boost.

## 2014-10-27 DIAGNOSIS — C50511 Malignant neoplasm of lower-outer quadrant of right female breast: Secondary | ICD-10-CM | POA: Diagnosis not present

## 2014-10-28 ENCOUNTER — Ambulatory Visit: Payer: BC Managed Care – PPO | Admitting: Radiation Oncology

## 2014-11-01 ENCOUNTER — Ambulatory Visit
Admission: RE | Admit: 2014-11-01 | Discharge: 2014-11-01 | Disposition: A | Discharge status: Home / Self Care | Payer: Medicare Other | Source: Ambulatory Visit | Attending: Radiation Oncology | Admitting: Radiation Oncology

## 2014-11-01 DIAGNOSIS — C50511 Malignant neoplasm of lower-outer quadrant of right female breast: Secondary | ICD-10-CM

## 2014-11-01 MED ORDER — RADIAPLEXRX EX GEL
Freq: Once | CUTANEOUS | Status: AC
  Administered 2014-11-01: 17:00:00 via TOPICAL

## 2014-11-01 MED ORDER — ALRA NON-METALLIC DEODORANT (RAD-ONC)
1.0000 "application " | Freq: Once | TOPICAL | Status: AC
  Administered 2014-11-01: 1 via TOPICAL

## 2014-11-02 ENCOUNTER — Ambulatory Visit
Admission: RE | Admit: 2014-11-02 | Discharge: 2014-11-02 | Disposition: A | Discharge status: Home / Self Care | Payer: Medicare Other | Source: Ambulatory Visit | Attending: Radiation Oncology | Admitting: Radiation Oncology

## 2014-11-02 DIAGNOSIS — C50511 Malignant neoplasm of lower-outer quadrant of right female breast: Secondary | ICD-10-CM | POA: Diagnosis not present

## 2014-11-03 ENCOUNTER — Ambulatory Visit
Admission: RE | Admit: 2014-11-03 | Discharge: 2014-11-03 | Disposition: A | Discharge status: Home / Self Care | Payer: Medicare Other | Source: Ambulatory Visit | Attending: Radiation Oncology | Admitting: Radiation Oncology

## 2014-11-03 DIAGNOSIS — C50511 Malignant neoplasm of lower-outer quadrant of right female breast: Secondary | ICD-10-CM | POA: Diagnosis not present

## 2014-11-04 ENCOUNTER — Ambulatory Visit
Admission: RE | Admit: 2014-11-04 | Discharge: 2014-11-04 | Disposition: A | Discharge status: Home / Self Care | Payer: Medicare Other | Source: Ambulatory Visit | Attending: Radiation Oncology | Admitting: Radiation Oncology

## 2014-11-04 DIAGNOSIS — C50511 Malignant neoplasm of lower-outer quadrant of right female breast: Secondary | ICD-10-CM | POA: Diagnosis not present

## 2014-11-05 ENCOUNTER — Ambulatory Visit
Admission: RE | Admit: 2014-11-05 | Discharge: 2014-11-05 | Disposition: A | Discharge status: Home / Self Care | Payer: Medicare Other | Source: Ambulatory Visit | Attending: Radiation Oncology | Admitting: Radiation Oncology

## 2014-11-05 DIAGNOSIS — C50511 Malignant neoplasm of lower-outer quadrant of right female breast: Secondary | ICD-10-CM | POA: Diagnosis not present

## 2014-11-08 ENCOUNTER — Ambulatory Visit
Admission: RE | Admit: 2014-11-08 | Discharge: 2014-11-08 | Disposition: A | Discharge status: Home / Self Care | Payer: Medicare Other | Source: Ambulatory Visit | Attending: Radiation Oncology | Admitting: Radiation Oncology

## 2014-11-08 ENCOUNTER — Ambulatory Visit
Admission: RE | Admit: 2014-11-08 | Discharge: 2014-11-08 | Disposition: A | Discharge status: Home / Self Care | Payer: BC Managed Care – PPO | Source: Ambulatory Visit | Attending: Radiation Oncology | Admitting: Radiation Oncology

## 2014-11-08 VITALS — BP 117/82 | HR 79 | Temp 98.1°F | Wt 169.3 lb

## 2014-11-08 DIAGNOSIS — C50511 Malignant neoplasm of lower-outer quadrant of right female breast: Secondary | ICD-10-CM | POA: Diagnosis not present

## 2014-11-08 NOTE — Progress Notes (Signed)
Weekly Management Note:  Site: Right breast Current Dose:  1000  cGy Projected Dose: 5000  cGy  Narrative: The patient is seen today for routine under treatment assessment. CBCT/MVCT images/port films were reviewed. The chart was reviewed.   She is without complaints today.  She uses Radioplex gel.  Physical Examination:  Filed Vitals:   11/08/14 0822  BP: 117/82  Pulse: 79  Temp: 98.1 F (36.7 C)  .  Weight: 169 lb 4.8 oz (76.794 kg).  No significant skin changes.  Impression: Tolerating radiation therapy well.  Plan: Continue radiation therapy as planned.

## 2014-11-08 NOTE — Progress Notes (Signed)
Weekly assessment of radiation to right breast.Completed 5 of 25 treatments.No skin changes.No pain or concerns. BP 117/82 mmHg  Pulse 79  Temp(Src) 98.1 F (36.7 C)  Wt 169 lb 4.8 oz (76.794 kg)

## 2014-11-09 ENCOUNTER — Ambulatory Visit
Admission: RE | Admit: 2014-11-09 | Discharge: 2014-11-09 | Disposition: A | Discharge status: Home / Self Care | Payer: Medicare Other | Source: Ambulatory Visit | Attending: Radiation Oncology | Admitting: Radiation Oncology

## 2014-11-09 DIAGNOSIS — C50511 Malignant neoplasm of lower-outer quadrant of right female breast: Secondary | ICD-10-CM | POA: Diagnosis not present

## 2014-11-10 ENCOUNTER — Ambulatory Visit
Admission: RE | Admit: 2014-11-10 | Discharge: 2014-11-10 | Disposition: A | Discharge status: Home / Self Care | Payer: Medicare Other | Source: Ambulatory Visit | Attending: Radiation Oncology | Admitting: Radiation Oncology

## 2014-11-10 DIAGNOSIS — C50511 Malignant neoplasm of lower-outer quadrant of right female breast: Secondary | ICD-10-CM | POA: Diagnosis not present

## 2014-11-11 ENCOUNTER — Ambulatory Visit
Admission: RE | Admit: 2014-11-11 | Discharge: 2014-11-11 | Disposition: A | Discharge status: Home / Self Care | Payer: Medicare Other | Source: Ambulatory Visit | Attending: Radiation Oncology | Admitting: Radiation Oncology

## 2014-11-11 DIAGNOSIS — C50511 Malignant neoplasm of lower-outer quadrant of right female breast: Secondary | ICD-10-CM | POA: Diagnosis not present

## 2014-11-11 DIAGNOSIS — Z923 Personal history of irradiation: Secondary | ICD-10-CM

## 2014-11-11 HISTORY — DX: Personal history of irradiation: Z92.3

## 2014-11-12 ENCOUNTER — Ambulatory Visit
Admission: RE | Admit: 2014-11-12 | Discharge: 2014-11-12 | Disposition: A | Discharge status: Home / Self Care | Payer: Medicare Other | Source: Ambulatory Visit | Attending: Radiation Oncology | Admitting: Radiation Oncology

## 2014-11-12 DIAGNOSIS — C50511 Malignant neoplasm of lower-outer quadrant of right female breast: Secondary | ICD-10-CM | POA: Diagnosis not present

## 2014-11-16 ENCOUNTER — Ambulatory Visit
Admission: RE | Admit: 2014-11-16 | Discharge: 2014-11-16 | Disposition: A | Discharge status: Home / Self Care | Payer: Medicare Other | Source: Ambulatory Visit | Attending: Radiation Oncology | Admitting: Radiation Oncology

## 2014-11-16 ENCOUNTER — Encounter: Payer: Self-pay | Admitting: Hematology and Oncology

## 2014-11-16 ENCOUNTER — Encounter: Payer: Self-pay | Admitting: Radiation Oncology

## 2014-11-16 ENCOUNTER — Other Ambulatory Visit (HOSPITAL_BASED_OUTPATIENT_CLINIC_OR_DEPARTMENT_OTHER): Payer: Medicare Other

## 2014-11-16 ENCOUNTER — Ambulatory Visit (HOSPITAL_BASED_OUTPATIENT_CLINIC_OR_DEPARTMENT_OTHER): Payer: Medicare Other | Admitting: Hematology and Oncology

## 2014-11-16 ENCOUNTER — Telehealth: Payer: Self-pay | Admitting: Hematology and Oncology

## 2014-11-16 VITALS — BP 120/84 | HR 76 | Resp 16 | Wt 163.1 lb

## 2014-11-16 VITALS — BP 120/84 | HR 76 | Temp 97.8°F | Resp 16 | Ht 65.0 in | Wt 163.1 lb

## 2014-11-16 DIAGNOSIS — C50511 Malignant neoplasm of lower-outer quadrant of right female breast: Secondary | ICD-10-CM

## 2014-11-16 DIAGNOSIS — Z170 Estrogen receptor positive status [ER+]: Secondary | ICD-10-CM | POA: Diagnosis not present

## 2014-11-16 DIAGNOSIS — L299 Pruritus, unspecified: Secondary | ICD-10-CM | POA: Diagnosis not present

## 2014-11-16 LAB — COMPREHENSIVE METABOLIC PANEL (CC13)
ALK PHOS: 52 U/L (ref 40–150)
ALT: 17 U/L (ref 0–55)
ANION GAP: 8 meq/L (ref 3–11)
AST: 13 U/L (ref 5–34)
Albumin: 3.9 g/dL (ref 3.5–5.0)
BILIRUBIN TOTAL: 0.37 mg/dL (ref 0.20–1.20)
BUN: 13.9 mg/dL (ref 7.0–26.0)
CALCIUM: 9.4 mg/dL (ref 8.4–10.4)
CO2: 21 mEq/L — ABNORMAL LOW (ref 22–29)
Chloride: 112 mEq/L — ABNORMAL HIGH (ref 98–109)
Creatinine: 0.8 mg/dL (ref 0.6–1.1)
EGFR: 83 mL/min/{1.73_m2} — AB (ref >90)
Glucose: 110 mg/dl (ref 70–140)
Potassium: 4.3 mEq/L (ref 3.5–5.1)
Sodium: 141 mEq/L (ref 136–145)
TOTAL PROTEIN: 6.9 g/dL (ref 6.4–8.3)

## 2014-11-16 LAB — CBC WITH DIFFERENTIAL/PLATELET
BASO%: 0.4 % (ref 0.0–2.0)
Basophils Absolute: 0 10*3/uL (ref 0.0–0.1)
EOS%: 2.2 % (ref 0.0–7.0)
Eosinophils Absolute: 0.1 10*3/uL (ref 0.0–0.5)
HCT: 42.4 % (ref 34.8–46.6)
HGB: 14 g/dL (ref 11.6–15.9)
LYMPH%: 13.5 % — AB (ref 14.0–49.7)
MCH: 31.1 pg (ref 25.1–34.0)
MCHC: 33.1 g/dL (ref 31.5–36.0)
MCV: 94.2 fL (ref 79.5–101.0)
MONO#: 0.3 10*3/uL (ref 0.1–0.9)
MONO%: 6.9 % (ref 0.0–14.0)
NEUT#: 3.1 10*3/uL (ref 1.5–6.5)
NEUT%: 77 % — AB (ref 38.4–76.8)
PLATELETS: 266 10*3/uL (ref 145–400)
RBC: 4.5 10*6/uL (ref 3.70–5.45)
RDW: 13.7 % (ref 11.2–14.5)
WBC: 4 10*3/uL (ref 3.9–10.3)
lymph#: 0.5 10*3/uL — ABNORMAL LOW (ref 0.9–3.3)

## 2014-11-16 MED ORDER — ANASTROZOLE 1 MG PO TABS: 1.0000 mg | ORAL_TABLET | Freq: Every day | ORAL | Status: DC

## 2014-11-16 NOTE — Progress Notes (Signed)
Patient Care Team: L.Donnie Coffin, MD as PCP - General (Family Medicine) Autumn Messing III, MD as Consulting Physician (General Surgery) Nicholas Lose, MD as Consulting Physician (Hematology and Oncology) Arloa Koh, MD as Consulting Physician (Radiation Oncology) Rockwell Germany, RN as Registered Nurse Mauro Kaufmann, RN as Registered Nurse Holley Bouche, NP as Nurse Practitioner (Nurse Practitioner)  DIAGNOSIS: Breast cancer of lower-outer quadrant of right female breast   Staging form: Breast, AJCC 7th Edition     Clinical stage from 05/12/2014: Stage IA (T1b, N0, M0) - Unsigned       Staging comments: Staged at breast conference 3.2.16      Pathologic stage from 06/25/2014: Stage IA (T1b, N0, cM0) - Signed by Enid Cutter, MD on 07/21/2014       Staging comments: Staged on final lumpectomy specimen by Dr. Lyndon Code    SUMMARY OF ONCOLOGIC HISTORY:   Breast cancer of lower-outer quadrant of right female breast   04/30/2014 Initial Diagnosis Right breast biopsy 6:00: Invasive mammary cancer favor ductal, grade 2, ER 96%, PR 45%, Ki-67 49%, HER-2 negative ratio 1.03   05/11/2014 Breast MRI Right breast 6:00 position: 0.9 x 0.7 x 0.6 cm mass, right breast 1 cm T2 hyperintense enhancing mass suggestive of benign fibroadenoma which has been stable   06/24/2014 Surgery Right lumpectomy: IDC Grade 2, 0.9 cm, 3 SLN Neg T1bN0 (Stage 1A) with IG DCIS, Oncotype DX recurrence score 28, 18% risk of recurrence   07/20/2014 -  Chemotherapy Taxotere Cytoxan Q 3 weeks X 4    CHIEF COMPLIANT: currently on radiation  INTERVAL HISTORY: Tammy Day is a 64 year old with above-mentioned history of right breast cancer currently on adjuvant radiation therapy. She is tolerating it extremely well. Does have mild soreness in the breasts.her major complaint today is severe itching of her hands. In fact itchiness throughout her body. Benadryl and topical sterols have been helping her but the itching comes back.she is  retired from her work and is enjoying her time in time.  REVIEW OF SYSTEMS:   Constitutional: Denies fevers, chills or abnormal weight loss Eyes: Denies blurriness of vision Ears, nose, mouth, throat, and face: Denies mucositis or sore throat Respiratory: Denies cough, dyspnea or wheezes Cardiovascular: Denies palpitation, chest discomfort or lower extremity swelling Gastrointestinal:  Denies nausea, heartburn or change in bowel habits Skin: diffuse itching throughout the skin Lymphatics: Denies new lymphadenopathy or easy bruising Neurological:Denies numbness, tingling or new weaknesses Behavioral/Psych: Mood is stable, no new changes  Breast:  denies any pain or lumps or nodules in either breasts All other systems were reviewed with the patient and are negative.  I have reviewed the past medical history, past surgical history, social history and family history with the patient and they are unchanged from previous note.  ALLERGIES:  is allergic to atorvastatin; nexium; other; pravastatin; prilosec; and rabeprazole.  MEDICATIONS:  Current Outpatient Prescriptions  Medication Sig Dispense Refill  . Ascorbic Acid 125 MG CHEW Chew by mouth.    Marland Kitchen aspirin 81 MG tablet Take 81 mg by mouth daily.    Marland Kitchen azelastine (ASTELIN) 0.1 % nasal spray Place into both nostrils 2 (two) times daily. Use in each nostril as directed    . Calcium-Phosphorus-Vitamin D (CALCIUM/D3 ADULT GUMMIES PO) Take by mouth.    . cholestyramine (QUESTRAN) 4 GM/DOSE powder Take by mouth daily.    . colesevelam (WELCHOL) 625 MG tablet Take 625 mg by mouth daily. 2 tab in the am, 2 in the pm.    .  diphenhydrAMINE (BENADRYL) 25 MG tablet Take 25 mg by mouth every 6 (six) hours as needed.    . fluticasone (FLONASE) 50 MCG/ACT nasal spray   1  . montelukast (SINGULAIR) 10 MG tablet Take 10 mg by mouth at bedtime.    . Multiple Vitamins-Minerals (ONE-A-DAY VITACRAVES PO) Take by mouth daily.    . Omega-3 Fatty Acids (FISH OIL)  1200 MG CAPS Take by mouth daily.    . RABEprazole (ACIPHEX) 20 MG tablet Take 20 mg by mouth as needed.     Marland Kitchen telmisartan-hydrochlorothiazide (MICARDIS HCT) 80-25 MG per tablet Take by mouth daily. 1/2 tab daily    . anastrozole (ARIMIDEX) 1 MG tablet Take 1 tablet (1 mg total) by mouth daily. 90 tablet 3   No current facility-administered medications for this visit.    PHYSICAL EXAMINATION: ECOG PERFORMANCE STATUS: 2 - Symptomatic, <50% confined to bed  Filed Vitals:   11/16/14 0904  BP: 120/84  Pulse: 76  Temp: 97.8 F (36.6 C)  Resp: 16   Filed Weights   11/16/14 0904  Weight: 163 lb 1.6 oz (73.982 kg)    GENERAL:alert, no distress and comfortable SKIN: skin color, texture, turgor are normal, no rashes or significant lesions EYES: normal, Conjunctiva are pink and non-injected, sclera clear OROPHARYNX:no exudate, no erythema and lips, buccal mucosa, and tongue normal  NECK: supple, thyroid normal size, non-tender, without nodularity LYMPH:  no palpable lymphadenopathy in the cervical, axillary or inguinal LUNGS: clear to auscultation and percussion with normal breathing effort HEART: regular rate & rhythm and no murmurs and no lower extremity edema ABDOMEN:abdomen soft, non-tender and normal bowel sounds Musculoskeletal:no cyanosis of digits and no clubbing  NEURO: alert & oriented x 3 with fluent speech, no focal motor/sensory deficits  LABORATORY DATA:  I have reviewed the data as listed   Chemistry      Component Value Date/Time   NA 141 11/16/2014 0849   K 4.3 11/16/2014 0849   CO2 21* 11/16/2014 0849   BUN 13.9 11/16/2014 0849   CREATININE 0.8 11/16/2014 0849      Component Value Date/Time   CALCIUM 9.4 11/16/2014 0849   ALKPHOS 52 11/16/2014 0849   AST 13 11/16/2014 0849   ALT 17 11/16/2014 0849   BILITOT 0.37 11/16/2014 0849       Lab Results  Component Value Date   WBC 4.0 11/16/2014   HGB 14.0 11/16/2014   HCT 42.4 11/16/2014   MCV 94.2  11/16/2014   PLT 266 11/16/2014   NEUTROABS 3.1 11/16/2014   ASSESSMENT & PLAN:  Breast cancer of lower-outer quadrant of right female breast Right breast invasive ductal carcinoma, grade 2, 0.9 cm, 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status post chemotherapy with Taxotere Cytoxan every 3 weeks 4 cycles started 07/20/2014 completed 09/21/2014  Current treatment: Adjuvant radiation therapy Treatment plan: Adjuvant anastrozole 1 mg daily 5 years after radiation is complete  Anastrozole counseling:We discussed the risks and benefits of anti-estrogen therapy with aromatase inhibitors. These include but not limited to insomnia, hot flashes, mood changes, vaginal dryness, bone density loss, and weight gain. Although rare, serious side effects including endometrial cancer, risk of blood clots were also discussed. We strongly believe that the benefits far outweigh the risks. Patient understands these risks and consented to starting treatment. Planned treatment duration is 5 years.  Skin itching: We will prescribe Medrol Dosepak.  If this does not resolve, she will see dermatology. Return to clinic in 1 months for follow-up and toxicity check   No orders of the defined types were placed in this encounter.   The patient has a good understanding of the overall plan. she agrees with it. she will call with any problems that may develop before the next visit here.   Gudena, Vinay K, MD      

## 2014-11-16 NOTE — Assessment & Plan Note (Signed)
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 These include but not limited to insomnia, hot flashes, mood changes, vaginal dryness, bone density loss, and weight gain. Although rare, serious side effects including endometrial cancer, risk of blood clots were also discussed. We strongly believe that the benefits far outweigh the risks. Patient understands these risks and consented to starting treatment. Planned treatment duration is 5 years.  Return to clinic in 3 months for follow-up and toxicity check

## 2014-11-16 NOTE — Progress Notes (Signed)
Weekly Management Note:  Site: Right breast Current Dose:  2000  cGy Projected Dose: 5000  cGy  Narrative: The patient is seen today for routine under treatment assessment. CBCT/MVCT images/port films were reviewed. The chart was reviewed.   She is without complaints today.  She is using Radioplex gel.  Physical Examination:  Filed Vitals:   11/16/14 0825  BP: 120/84  Pulse: 76  Resp: 16  .  Weight: 163 lb 1.6 oz (73.982 kg).  There is faint erythema along the right breast with no areas of desquamation.  Impression: Tolerating radiation therapy well.  Plan: Continue radiation therapy as planned.

## 2014-11-16 NOTE — Telephone Encounter (Signed)
Gave avs & calendar for September °

## 2014-11-16 NOTE — Progress Notes (Addendum)
Vitals stable. Six pound weight loss noted. Reports right breast remains tender. Denies nipple discharge or warmth in right breast. Denies skin changes within treatment field. Reports using radiaplex as directed. Denies fatigue. Reports she was able to take care of her 49 month old granddaughter this weekend.  BP 120/84 mmHg  Pulse 76  Resp 16  Wt 163 lb 1.6 oz (73.982 kg)  SpO2 100% Wt Readings from Last 3 Encounters:  11/16/14 163 lb 1.6 oz (73.982 kg)  11/08/14 169 lb 4.8 oz (76.794 kg)  10/15/14 172 lb 3.2 oz (78.109 kg)

## 2014-11-17 ENCOUNTER — Ambulatory Visit
Admission: RE | Admit: 2014-11-17 | Discharge: 2014-11-17 | Disposition: A | Discharge status: Home / Self Care | Payer: Medicare Other | Source: Ambulatory Visit | Attending: Radiation Oncology | Admitting: Radiation Oncology

## 2014-11-17 DIAGNOSIS — C50511 Malignant neoplasm of lower-outer quadrant of right female breast: Secondary | ICD-10-CM | POA: Diagnosis not present

## 2014-11-18 ENCOUNTER — Ambulatory Visit
Admission: RE | Admit: 2014-11-18 | Discharge: 2014-11-18 | Disposition: A | Discharge status: Home / Self Care | Payer: Medicare Other | Source: Ambulatory Visit | Attending: Radiation Oncology | Admitting: Radiation Oncology

## 2014-11-18 DIAGNOSIS — C50511 Malignant neoplasm of lower-outer quadrant of right female breast: Secondary | ICD-10-CM | POA: Diagnosis not present

## 2014-11-19 ENCOUNTER — Other Ambulatory Visit: Payer: Self-pay

## 2014-11-19 ENCOUNTER — Ambulatory Visit
Admission: RE | Admit: 2014-11-19 | Discharge: 2014-11-19 | Disposition: A | Discharge status: Home / Self Care | Payer: Medicare Other | Source: Ambulatory Visit | Attending: Radiation Oncology | Admitting: Radiation Oncology

## 2014-11-19 DIAGNOSIS — C50511 Malignant neoplasm of lower-outer quadrant of right female breast: Secondary | ICD-10-CM

## 2014-11-19 MED ORDER — METHYLPREDNISOLONE 4 MG PO TBPK: ORAL_TABLET | ORAL | Status: DC

## 2014-11-22 ENCOUNTER — Ambulatory Visit
Admission: RE | Admit: 2014-11-22 | Discharge: 2014-11-22 | Disposition: A | Discharge status: Home / Self Care | Payer: BC Managed Care – PPO | Source: Ambulatory Visit | Attending: Radiation Oncology | Admitting: Radiation Oncology

## 2014-11-22 ENCOUNTER — Ambulatory Visit
Admission: RE | Admit: 2014-11-22 | Discharge: 2014-11-22 | Disposition: A | Discharge status: Home / Self Care | Payer: Medicare Other | Source: Ambulatory Visit | Attending: Radiation Oncology | Admitting: Radiation Oncology

## 2014-11-22 VITALS — BP 128/83 | HR 79 | Temp 97.8°F | Wt 166.6 lb

## 2014-11-22 DIAGNOSIS — C50511 Malignant neoplasm of lower-outer quadrant of right female breast: Secondary | ICD-10-CM

## 2014-11-22 MED ORDER — RADIAPLEXRX EX GEL
Freq: Once | CUTANEOUS | Status: AC
  Administered 2014-11-22: 09:00:00 via TOPICAL

## 2014-11-22 NOTE — Progress Notes (Signed)
Weekly Management Note:  Site: Right breast Current Dose:  2800  cGy Projected Dose: 5000  cGy  Narrative: The patient is seen today for routine under treatment assessment. CBCT/MVCT images/port films were reviewed. The chart was reviewed.   She is without complaints today except for small areas of pruritus..  She continues with her Radioplex gel.  Physical Examination:  Filed Vitals:   11/22/14 0822  BP: 128/83  Pulse: 79  Temp: 97.8 F (36.6 C)  .  Weight: 166 lb 9.6 oz (75.569 kg).  There is mild erythema along the right breast with no areas of desquamation.  Impression: Tolerating radiation therapy well.  Plan: Continue radiation therapy as planned.

## 2014-11-22 NOTE — Addendum Note (Signed)
Encounter addended by: Norm Salt, RN on: 11/22/2014  8:51 AM<BR>     Documentation filed: Inpatient MAR

## 2014-11-22 NOTE — Progress Notes (Signed)
Weekly assessment of radiation to right breast.Completed 14 of 25 treatments.Skin is pink with a few itching bumps.Given another tube of radiaplex. BP 128/83 mmHg  Pulse 79  Temp(Src) 97.8 F (36.6 C)  Wt 166 lb 9.6 oz (75.569 kg)

## 2014-11-23 ENCOUNTER — Ambulatory Visit
Admission: RE | Admit: 2014-11-23 | Discharge: 2014-11-23 | Disposition: A | Discharge status: Home / Self Care | Payer: Medicare Other | Source: Ambulatory Visit | Attending: Radiation Oncology | Admitting: Radiation Oncology

## 2014-11-23 DIAGNOSIS — Z780 Asymptomatic menopausal state: Secondary | ICD-10-CM | POA: Diagnosis not present

## 2014-11-23 DIAGNOSIS — C50511 Malignant neoplasm of lower-outer quadrant of right female breast: Secondary | ICD-10-CM | POA: Diagnosis not present

## 2014-11-24 ENCOUNTER — Ambulatory Visit
Admission: RE | Admit: 2014-11-24 | Discharge: 2014-11-24 | Disposition: A | Discharge status: Home / Self Care | Payer: Medicare Other | Source: Ambulatory Visit | Attending: Radiation Oncology | Admitting: Radiation Oncology

## 2014-11-24 DIAGNOSIS — C50511 Malignant neoplasm of lower-outer quadrant of right female breast: Secondary | ICD-10-CM | POA: Diagnosis not present

## 2014-11-25 ENCOUNTER — Ambulatory Visit
Admission: RE | Admit: 2014-11-25 | Discharge: 2014-11-25 | Disposition: A | Discharge status: Home / Self Care | Payer: Medicare Other | Source: Ambulatory Visit | Attending: Radiation Oncology | Admitting: Radiation Oncology

## 2014-11-25 DIAGNOSIS — C50511 Malignant neoplasm of lower-outer quadrant of right female breast: Secondary | ICD-10-CM | POA: Diagnosis not present

## 2014-11-26 ENCOUNTER — Ambulatory Visit
Admission: RE | Admit: 2014-11-26 | Discharge: 2014-11-26 | Disposition: A | Discharge status: Home / Self Care | Payer: Medicare Other | Source: Ambulatory Visit | Attending: Radiation Oncology | Admitting: Radiation Oncology

## 2014-11-26 DIAGNOSIS — C50511 Malignant neoplasm of lower-outer quadrant of right female breast: Secondary | ICD-10-CM | POA: Diagnosis not present

## 2014-11-29 ENCOUNTER — Ambulatory Visit
Admission: RE | Admit: 2014-11-29 | Discharge: 2014-11-29 | Disposition: A | Discharge status: Home / Self Care | Payer: BC Managed Care – PPO | Source: Ambulatory Visit | Attending: Radiation Oncology | Admitting: Radiation Oncology

## 2014-11-29 ENCOUNTER — Ambulatory Visit
Admission: RE | Admit: 2014-11-29 | Discharge: 2014-11-29 | Disposition: A | Discharge status: Home / Self Care | Payer: Medicare Other | Source: Ambulatory Visit | Attending: Radiation Oncology | Admitting: Radiation Oncology

## 2014-11-29 ENCOUNTER — Encounter: Payer: Self-pay | Admitting: Radiation Oncology

## 2014-11-29 VITALS — BP 108/75 | HR 84 | Temp 97.8°F | Resp 20 | Wt 169.7 lb

## 2014-11-29 DIAGNOSIS — C50511 Malignant neoplasm of lower-outer quadrant of right female breast: Secondary | ICD-10-CM | POA: Diagnosis not present

## 2014-11-29 NOTE — Progress Notes (Signed)
Weekly rad txs right breast 19/25 completed, erythema.k.k, dermatitis, using radiaplex 3-4 day, mild itching at times, appetite good, some fatigue, mild,  8:15 AM BP 108/75 mmHg  Pulse 84  Temp(Src) 97.8 F (36.6 C) (Oral)  Resp 20  Wt 169 lb 11.2 oz (76.975 kg)  Wt Readings from Last 3 Encounters:  11/29/14 169 lb 11.2 oz (76.975 kg)  11/22/14 166 lb 9.6 oz (75.569 kg)  11/16/14 163 lb 1.6 oz (73.982 kg)

## 2014-11-29 NOTE — Progress Notes (Signed)
Weekly Management Note:  Site: Right breast Current Dose:  3800  cGy Projected Dose: 5000  cGy  Narrative: The patient is seen today for routine under treatment assessment. CBCT/MVCT images/port films were reviewed. The chart was reviewed.   She is without complaints today although she does report slight puritic dermatitis along the upper inner quadrant of her right breast.  She uses Radioplex gel.  Physical Examination:  Filed Vitals:   11/29/14 0814  BP: 108/75  Pulse: 84  Temp: 97.8 F (36.6 C)  Resp: 20  .  Weight: 169 lb 11.2 oz (76.975 kg).  There is mild erythema along the right breast which is somewhat papular along the upper inner quadrant.  No areas of desquamation.  Impression: Tolerating radiation therapy well.  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  Plan: Continue radiation therapy as planned.  She will finish radiation therapy next week.

## 2014-11-30 ENCOUNTER — Ambulatory Visit
Admission: RE | Admit: 2014-11-30 | Discharge: 2014-11-30 | Disposition: A | Discharge status: Home / Self Care | Payer: Medicare Other | Source: Ambulatory Visit | Attending: Radiation Oncology | Admitting: Radiation Oncology

## 2014-11-30 DIAGNOSIS — C50511 Malignant neoplasm of lower-outer quadrant of right female breast: Secondary | ICD-10-CM | POA: Diagnosis not present

## 2014-12-01 ENCOUNTER — Ambulatory Visit
Admission: RE | Admit: 2014-12-01 | Discharge: 2014-12-01 | Disposition: A | Discharge status: Home / Self Care | Payer: Medicare Other | Source: Ambulatory Visit | Attending: Radiation Oncology | Admitting: Radiation Oncology

## 2014-12-01 DIAGNOSIS — C50511 Malignant neoplasm of lower-outer quadrant of right female breast: Secondary | ICD-10-CM | POA: Diagnosis not present

## 2014-12-02 ENCOUNTER — Ambulatory Visit
Admission: RE | Admit: 2014-12-02 | Discharge: 2014-12-02 | Disposition: A | Discharge status: Home / Self Care | Payer: Medicare Other | Source: Ambulatory Visit | Attending: Radiation Oncology | Admitting: Radiation Oncology

## 2014-12-02 DIAGNOSIS — C50511 Malignant neoplasm of lower-outer quadrant of right female breast: Secondary | ICD-10-CM | POA: Diagnosis not present

## 2014-12-03 ENCOUNTER — Ambulatory Visit
Admission: RE | Admit: 2014-12-03 | Discharge: 2014-12-03 | Disposition: A | Discharge status: Home / Self Care | Payer: Medicare Other | Source: Ambulatory Visit | Attending: Radiation Oncology | Admitting: Radiation Oncology

## 2014-12-03 DIAGNOSIS — C50511 Malignant neoplasm of lower-outer quadrant of right female breast: Secondary | ICD-10-CM | POA: Diagnosis not present

## 2014-12-06 ENCOUNTER — Ambulatory Visit
Admission: RE | Admit: 2014-12-06 | Discharge: 2014-12-06 | Disposition: A | Discharge status: Home / Self Care | Payer: Medicare Other | Source: Ambulatory Visit | Attending: Radiation Oncology | Admitting: Radiation Oncology

## 2014-12-06 ENCOUNTER — Ambulatory Visit
Admission: RE | Admit: 2014-12-06 | Discharge: 2014-12-06 | Disposition: A | Discharge status: Home / Self Care | Payer: BC Managed Care – PPO | Source: Ambulatory Visit | Attending: Radiation Oncology | Admitting: Radiation Oncology

## 2014-12-06 VITALS — BP 125/76 | HR 79 | Temp 97.8°F | Wt 167.9 lb

## 2014-12-06 DIAGNOSIS — C50511 Malignant neoplasm of lower-outer quadrant of right female breast: Secondary | ICD-10-CM | POA: Diagnosis not present

## 2014-12-06 NOTE — Progress Notes (Signed)
   Weekly Management Note:  Outpatient    ICD-9-CM ICD-10-CM   1. Breast cancer of lower-outer quadrant of right female breast 174.5 C50.511     Current Dose:  48 Gy  Projected Dose: 50 Gy   Narrative:  The patient presents for routine under treatment assessment.  CBCT/MVCT images/Port film x-rays were reviewed.  The chart was checked.  Skin irritation, but no concerns  Physical Findings:  weight is 167 lb 14.4 oz (76.159 kg). Her temperature is 97.8 F (36.6 C). Her blood pressure is 125/76 and her pulse is 79.   Wt Readings from Last 3 Encounters:  12/06/14 167 lb 14.4 oz (76.159 kg)  11/29/14 169 lb 11.2 oz (76.975 kg)  11/22/14 166 lb 9.6 oz (75.569 kg)   Red erythematous skin over right breast, skin intact  Impression:  The patient is tolerating radiotherapy.  Plan:  Continue radiotherapy as planned.  F/u card given  ________________________________   Eppie Gibson, M.D.

## 2014-12-06 NOTE — Progress Notes (Addendum)
Weekly assessment of radiation to right breast.Completed 24 of 25 treatments.Skin is red with mild rash mid chest.Continue radiaplex 2 to 3 times daily.Given one month follow up card.Knows to call if any questions or concerns. BP 125/76 mmHg  Pulse 79  Temp(Src) 97.8 F (36.6 C)  Wt 167 lb 14.4 oz (76.159 kg)

## 2014-12-07 ENCOUNTER — Ambulatory Visit
Admission: RE | Admit: 2014-12-07 | Discharge: 2014-12-07 | Disposition: A | Discharge status: Home / Self Care | Payer: Medicare Other | Source: Ambulatory Visit | Attending: Radiation Oncology | Admitting: Radiation Oncology

## 2014-12-07 DIAGNOSIS — C50511 Malignant neoplasm of lower-outer quadrant of right female breast: Secondary | ICD-10-CM | POA: Diagnosis not present

## 2014-12-08 ENCOUNTER — Encounter: Payer: Self-pay | Admitting: Radiation Oncology

## 2014-12-08 ENCOUNTER — Other Ambulatory Visit: Payer: Self-pay | Admitting: Adult Health

## 2014-12-08 DIAGNOSIS — C50511 Malignant neoplasm of lower-outer quadrant of right female breast: Secondary | ICD-10-CM

## 2014-12-08 NOTE — Progress Notes (Signed)
[facility] End of Treatment Note  Name:Tammy Day  Date: 12/08/2014 QJF:[id number] DOB:12-Sep-1950   Status:outpatient    CC: Donnie Coffin, MD  Dr. Autumn Messing III  REFERRING PHYSICIAN:   Dr. Autumn Messing  III   DIAGNOSIS:  Stage I A (T1b N0 M0) invasive ductal/DCIS of the right breast    INDICATION FOR TREATMENT: Curative   TREATMENT DATES: 11/02/2014 through 12/07/2014                          SITE/DOSE:   Right breast 5000 cGy in 25 sessions, no boost                         BEAMS/ENERGY:  6 MV photons, tangential fields               NARRATIVE: The patient tolerated treatment well although she did have puritic dermatitis along the upper inner quadrant of her breast with patchy dry desquamation by completion of therapy.  She used Radiplex Gel during her course of therapy.                           PLAN: Routine followup in one month. Patient instructed to call if questions or worsening complaints in interim.

## 2014-12-14 ENCOUNTER — Telehealth: Payer: Self-pay | Admitting: *Deleted

## 2014-12-14 NOTE — Telephone Encounter (Signed)
Called pt to congratulate on completion of xrt. Relate doing well and without complaints. Encourage pt to call with needs or questions. Received verbal understanding. Discussed future appts and support services. Gave pt contact information for Ottis Stain

## 2014-12-16 DIAGNOSIS — R350 Frequency of micturition: Secondary | ICD-10-CM | POA: Diagnosis not present

## 2014-12-16 DIAGNOSIS — N390 Urinary tract infection, site not specified: Secondary | ICD-10-CM | POA: Diagnosis not present

## 2014-12-29 DIAGNOSIS — Z23 Encounter for immunization: Secondary | ICD-10-CM | POA: Diagnosis not present

## 2015-01-01 ENCOUNTER — Encounter: Payer: Self-pay | Admitting: Radiation Oncology

## 2015-01-04 ENCOUNTER — Encounter: Payer: Medicare Other | Admitting: Nurse Practitioner

## 2015-01-04 ENCOUNTER — Ambulatory Visit
Admission: RE | Admit: 2015-01-04 | Discharge: 2015-01-04 | Disposition: A | Discharge status: Home / Self Care | Payer: Medicare Other | Source: Ambulatory Visit | Attending: Radiation Oncology | Admitting: Radiation Oncology

## 2015-01-04 ENCOUNTER — Encounter: Payer: Self-pay | Admitting: Radiation Oncology

## 2015-01-04 ENCOUNTER — Telehealth: Payer: Self-pay | Admitting: Nurse Practitioner

## 2015-01-04 VITALS — BP 135/78 | HR 73 | Temp 97.5°F | Ht 65.0 in | Wt 168.7 lb

## 2015-01-04 DIAGNOSIS — C50511 Malignant neoplasm of lower-outer quadrant of right female breast: Secondary | ICD-10-CM

## 2015-01-04 HISTORY — DX: Personal history of irradiation: Z92.3

## 2015-01-04 MED ORDER — RADIAPLEXRX EX GEL
Freq: Once | CUTANEOUS | Status: AC
  Administered 2015-01-04: 11:00:00 via TOPICAL

## 2015-01-04 NOTE — Addendum Note (Signed)
Encounter addended by: Benn Moulder, RN on: 01/04/2015 11:10 AM<BR>     Documentation filed: Chief Complaint Section

## 2015-01-04 NOTE — Telephone Encounter (Signed)
Left message for return call to reschedule appointment from this morning.

## 2015-01-04 NOTE — Progress Notes (Addendum)
Ms. Blazier for reassessment s/p xrt to hr right breast.  Note erythema in the uppr outer portion of her breast with level 1/10 tenderness.

## 2015-01-04 NOTE — Progress Notes (Signed)
CC: Dr. Autumn Messing III   Follow-up note:   Tammy Day returns today approximately 1 month following completion of radiation therapy following conservative surgery and adjuvant chemotherapy in the management of her T1b N0 invasive ductal/DCIS of the right breast. She is without complaints today.  She is pleased with her cosmesis. She began anastrozole 2 weeks ago. She is to meet Chestine Spore from survivorship this morning.   Physical examination: Alert and oriented. Filed Vitals:   01/04/15 1040  BP: 135/78  Pulse: 73  Temp: 97.5 F (36.4 C)    head and neck examination: Remarkable for regrowth of hair.  Nodes: Without palpable cervical, supraclavicular, or axillary lymphadenopathy. Breast: There is residual hyperpigmentation is slight erythema along the right breast.  There is minimal thickening.  No masses are appreciated. Extremities: Without edema.   Impression: Satisfactory progress.  She'll maintain follow-up with Dr. Lindi Adie, she will see Dr. Marlou Starks in December. Her screening mammography was in  January, so she can have a baseline right breast mammogram in a screening left breast mammogram in late January/February. She is not scheduled here for a formal follow-up visit.   Plan: As above.

## 2015-01-11 ENCOUNTER — Encounter: Payer: Self-pay | Admitting: Nurse Practitioner

## 2015-01-11 DIAGNOSIS — C50511 Malignant neoplasm of lower-outer quadrant of right female breast: Secondary | ICD-10-CM

## 2015-01-11 NOTE — Progress Notes (Signed)
The Survivorship Care Plan was mailed to Tammy Day as she reported not being able to come in to the Survivorship Clinic for an in-person visit at this time. A letter was mailed to her outlining the purpose of the content of the care plan, as well as encouraging her to reach out to me with any questions or concerns.  My business card was included in the correspondence to the patient as well.  A copy of the care plan was also routed/faxed/mailed to Donnie Coffin, MD, the patient's PCP.  I will not be placing any follow-up appointments to the Survivorship Clinic for [name], but I am happy to see her at any time in the future for any survivorship concerns that may arise. Thank you for allowing me to participate in her care!  Kenn File, [place] [phone]

## 2015-01-12 ENCOUNTER — Telehealth: Payer: Self-pay | Admitting: Nurse Practitioner

## 2015-01-12 NOTE — Telephone Encounter (Signed)
Called and spoke with patient after receiving a phone call from her about her missed appointment.  Advised that I had mailed her care plan to her and encouraged her to review it upon its receipt, as well as to reach out to me with questions or if she wanted to make an appointment to discuss in person. Pt voiced understanding.

## 2015-01-12 NOTE — Telephone Encounter (Signed)
Duplicate. Opened in error.

## 2015-01-25 ENCOUNTER — Ambulatory Visit (HOSPITAL_BASED_OUTPATIENT_CLINIC_OR_DEPARTMENT_OTHER): Payer: Medicare Other | Admitting: Hematology and Oncology

## 2015-01-25 ENCOUNTER — Encounter: Payer: Self-pay | Admitting: Hematology and Oncology

## 2015-01-25 ENCOUNTER — Telehealth: Payer: Self-pay | Admitting: Hematology and Oncology

## 2015-01-25 VITALS — BP 127/83 | HR 93 | Temp 97.6°F | Resp 19 | Ht 65.0 in | Wt 167.7 lb

## 2015-01-25 DIAGNOSIS — Z170 Estrogen receptor positive status [ER+]: Secondary | ICD-10-CM | POA: Diagnosis not present

## 2015-01-25 DIAGNOSIS — C50511 Malignant neoplasm of lower-outer quadrant of right female breast: Secondary | ICD-10-CM

## 2015-01-25 DIAGNOSIS — Z79811 Long term (current) use of aromatase inhibitors: Secondary | ICD-10-CM | POA: Diagnosis not present

## 2015-01-25 MED ORDER — REPLENS VA GEL: 1.0000 "application " | Freq: Every day | VAGINAL | Status: AC

## 2015-01-25 NOTE — Addendum Note (Signed)
Addended by: Prentiss Bells on: 01/25/2015 11:38 AM   Modules accepted: Medications

## 2015-01-25 NOTE — Progress Notes (Signed)
Patient Care Team: L.Donnie Coffin, MD as PCP - General (Family Medicine) Autumn Messing III, MD as Consulting Physician (General Surgery) Nicholas Lose, MD as Consulting Physician (Hematology and Oncology) Arloa Koh, MD as Consulting Physician (Radiation Oncology) Rockwell Germany, RN as Registered Nurse Mauro Kaufmann, RN as Registered Nurse Holley Bouche, NP as Nurse Practitioner (Nurse Practitioner) Sylvan Cheese, NP as Nurse Practitioner (Hematology and Oncology)  DIAGNOSIS: Breast cancer of lower-outer quadrant of right female breast [facility])   Staging form: Breast, AJCC 7th Edition     Clinical stage from 05/12/2014: Stage IA (T1b, N0, M0) - Unsigned       Staging comments: Staged at breast conference 3.2.16      Pathologic stage from 06/25/2014: Stage IA (T1b, N0, cM0) - Signed by Enid Cutter, MD on 07/21/2014       Staging comments: Staged on final lumpectomy specimen by Dr. Lyndon Code    SUMMARY OF ONCOLOGIC HISTORY:   Breast cancer of lower-outer quadrant of right female breast ([place])   04/05/2014 Mammogram Right breast: possible new masses warranting further imaging   04/19/2014 Breast US Right breast: irregular hypoechoic mass in the 6 o'clock location, 4 cm from the nipple and posterior in location, measuring 0.9 x 0.6 x 0.6 cm. No other findings. No LAD.   04/30/2014 Initial Biopsy Right breast biopsy 6:00: Invasive mammary cancer favor ductal, grade 2, ER+ (96%), PR+ (45%), HER-2 negative (ratio 1.03), Ki-67 49%.   05/11/2014 Breast MRI Right breast 6:00 position: 0.9 x 0.7 x 0.6 cm mass, right breast 1 cm T2 hyperintense enhancing mass suggestive of benign fibroadenoma which has been stable   05/12/2014 Clinical Stage Stage IA: T1b N0   06/24/2014 Definitive Surgery Right lumpectomy/[facility] Marlou Starks): Invasive ductal carcinoma, grade 2, 0.9 cm, 3 LN removed and negative (0/3). DCIS, intermediate grade.    06/24/2014 Pathologic Stage Stage IA: pT1b pN0   06/24/2014 Oncotype testing  Oncotype DX recurrence score 28, (18% ROR)    07/20/2014 - 09/21/2014 Chemotherapy Adjuvant docetaxel and cyclophosphamide q 3 weeks x 4 cycles   11/02/2014 - 12/07/2014 Radiation Therapy Adjuvant RT Valere Dross): Right breast 50 Gy over 25 fractions, no boost   12/25/2014 -  Anti-estrogen oral therapy Anastrozole 1 mg daily.  Planned duration of therapy 5 years.   01/11/2015 Survivorship Survivorship care plan completed and mailed to patient in lieu of in person visit.    CHIEF COMPLIANT: Follow-up on anastrozole  INTERVAL HISTORY: Tammy Day is a 64 year old with above-mentioned history of right breast cancer currently on adjuvant antiestrogen therapy with anastrozole. She appears to be tolerating it extremely well. She does have mild hot flashes. She was very anxious about muscle aches and pains. She has mild discomfort in the right shoulder but she attributes this to her computer table knocking the right height. Otherwise she does not have any major issues. Her hair is growing and she is very excited about that. She has recovered from the effects of adjuvant radiation. She is complaining of profound vaginal dryness which has been going on even prior to anastrozole therapy.  REVIEW OF SYSTEMS:   Constitutional: Denies fevers, chills or abnormal weight loss Eyes: Denies blurriness of vision Ears, nose, mouth, throat, and face: Denies mucositis or sore throat Respiratory: Denies cough, dyspnea or wheezes Cardiovascular: Denies palpitation, chest discomfort or lower extremity swelling Gastrointestinal:  Denies nausea, heartburn or change in bowel habits Skin: Denies abnormal skin rashes Lymphatics: Denies new lymphadenopathy or easy bruising Neurological:Denies numbness, tingling  or new weaknesses Behavioral/Psych: Mood is stable, no new changes  Breast:  denies any pain or lumps or nodules in either breasts All other systems were reviewed with the patient and are negative.  I have reviewed  the past medical history, past surgical history, social history and family history with the patient and they are unchanged from previous note.  ALLERGIES:  is allergic to atorvastatin; nexium; other; pravastatin; prilosec; and rabeprazole.  MEDICATIONS:  Current Outpatient Prescriptions  Medication Sig Dispense Refill  . anastrozole (ARIMIDEX) 1 MG tablet Take 1 tablet (1 mg total) by mouth daily. 90 tablet 3  . Ascorbic Acid 125 MG CHEW Chew by mouth.    Marland Kitchen aspirin 81 MG tablet Take 81 mg by mouth daily.    Marland Kitchen azelastine (ASTELIN) 0.1 % nasal spray Place into both nostrils 2 (two) times daily. Use in each nostril as directed    . Calcium-Phosphorus-Vitamin D (CALCIUM/D3 ADULT GUMMIES PO) Take by mouth.    . cholestyramine (QUESTRAN) 4 GM/DOSE powder Take by mouth daily.    . colesevelam (WELCHOL) 625 MG tablet Take 625 mg by mouth daily. 2 tab in the am, 2 in the pm.    . diphenhydrAMINE (BENADRYL) 25 MG tablet Take 25 mg by mouth every 6 (six) hours as needed.    . fluticasone (FLONASE) 50 MCG/ACT nasal spray   1  . montelukast (SINGULAIR) 10 MG tablet Take 10 mg by mouth at bedtime. As Needed    . Multiple Vitamins-Minerals (ONE-A-DAY VITACRAVES PO) Take by mouth daily.    . Omega-3 Fatty Acids (FISH OIL) 1200 MG CAPS Take by mouth daily.    . RABEprazole (ACIPHEX) 20 MG tablet Take 20 mg by mouth as needed.     . Vaginal Lubricant (REPLENS) GEL Place 1 application vaginally daily.  0   No current facility-administered medications for this visit.    PHYSICAL EXAMINATION: ECOG PERFORMANCE STATUS: 1 - Symptomatic but completely ambulatory  Filed Vitals:   01/25/15 0921  BP: 127/83  Pulse: 93  Temp: 97.6 F (36.4 C)  Resp: 19   Filed Weights   01/25/15 0921  Weight: 167 lb 11.2 oz (76.068 kg)    GENERAL:alert, no distress and comfortable SKIN: skin color, texture, turgor are normal, no rashes or significant lesions EYES: normal, Conjunctiva are pink and non-injected, sclera  clear OROPHARYNX:no exudate, no erythema and lips, buccal mucosa, and tongue normal  NECK: supple, thyroid normal size, non-tender, without nodularity LYMPH:  no palpable lymphadenopathy in the cervical, axillary or inguinal LUNGS: clear to auscultation and percussion with normal breathing effort HEART: regular rate & rhythm and no murmurs and no lower extremity edema ABDOMEN:abdomen soft, non-tender and normal bowel sounds Musculoskeletal:no cyanosis of digits and no clubbing  NEURO: alert & oriented x 3 with fluent speech, very mild peripheral neuropathy  LABORATORY DATA:  I have reviewed the data as listed   Chemistry      Component Value Date/Time   NA 141 11/16/2014 0849   K 4.3 11/16/2014 0849   CO2 21* 11/16/2014 0849   BUN 13.9 11/16/2014 0849   CREATININE 0.8 11/16/2014 0849      Component Value Date/Time   CALCIUM 9.4 11/16/2014 0849   ALKPHOS 52 11/16/2014 0849   AST 13 11/16/2014 0849   ALT 17 11/16/2014 0849   BILITOT 0.37 11/16/2014 0849       Lab Results  Component Value Date   WBC 4.0 11/16/2014   HGB 14.0 11/16/2014   HCT  42.4 11/16/2014   MCV 94.2 11/16/2014   PLT 266 11/16/2014   NEUTROABS 3.1 11/16/2014   ASSESSMENT & PLAN:  Breast cancer of lower-outer quadrant of right female breast Right breast invasive ductal carcinoma, grade 2, 0.9 cm, 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  Profound vaginal dryness: I recommended Replens and coconut oil. We also discussed the role of vaginal laser therapy be a backup if necessary.  Return to clinic in 6 months for follow-up and breast exams  No orders of the defined types were placed in this encounter.   The patient has a good understanding of the overall plan. she agrees with it. she will call with any problems that may develop before the next visit here.   Rulon Eisenmenger, MD 01/25/2015

## 2015-01-25 NOTE — Telephone Encounter (Signed)
Called and lefta message with a 6month appointment °

## 2015-01-25 NOTE — Assessment & Plan Note (Signed)
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

## 2015-03-01 ENCOUNTER — Telehealth: Payer: Self-pay

## 2015-03-01 NOTE — Telephone Encounter (Signed)
Pt left message on Selena Lesser, NP phone stating she had some discharge under breast with skin irritation, wanted to know if she should see her PCP for this issue. Called patient back. States she still has the d/c and pink skin under breast, states it just feels raw. Informed pt to see PCP, and if they felt she needed to be seen at [facility] we can see her. Pt verbalized understanding.

## 2015-03-02 DIAGNOSIS — N6089 Other benign mammary dysplasias of unspecified breast: Secondary | ICD-10-CM | POA: Diagnosis not present

## 2015-04-21 DIAGNOSIS — D229 Melanocytic nevi, unspecified: Secondary | ICD-10-CM

## 2015-04-21 HISTORY — DX: Melanocytic nevi, unspecified: D22.9

## 2015-04-25 ENCOUNTER — Other Ambulatory Visit: Payer: Self-pay | Admitting: Hematology and Oncology

## 2015-04-25 ENCOUNTER — Other Ambulatory Visit: Payer: Self-pay | Admitting: Radiation Oncology

## 2015-04-25 DIAGNOSIS — Z853 Personal history of malignant neoplasm of breast: Secondary | ICD-10-CM

## 2015-04-29 ENCOUNTER — Ambulatory Visit
Admission: RE | Admit: 2015-04-29 | Discharge: 2015-04-29 | Disposition: A | Discharge status: Home / Self Care | Payer: Medicare Other | Source: Ambulatory Visit | Attending: Hematology and Oncology | Admitting: Hematology and Oncology

## 2015-04-29 DIAGNOSIS — Z853 Personal history of malignant neoplasm of breast: Secondary | ICD-10-CM

## 2015-05-02 DIAGNOSIS — E7800 Pure hypercholesterolemia, unspecified: Secondary | ICD-10-CM | POA: Diagnosis not present

## 2015-07-26 ENCOUNTER — Telehealth: Payer: Self-pay | Admitting: Hematology and Oncology

## 2015-07-26 ENCOUNTER — Ambulatory Visit (HOSPITAL_BASED_OUTPATIENT_CLINIC_OR_DEPARTMENT_OTHER): Payer: Medicare Other | Admitting: Hematology and Oncology

## 2015-07-26 ENCOUNTER — Encounter: Payer: Self-pay | Admitting: Hematology and Oncology

## 2015-07-26 VITALS — BP 127/88 | HR 78 | Temp 98.5°F | Resp 18 | Wt 168.1 lb

## 2015-07-26 DIAGNOSIS — C50511 Malignant neoplasm of lower-outer quadrant of right female breast: Secondary | ICD-10-CM | POA: Diagnosis not present

## 2015-07-26 DIAGNOSIS — Z79811 Long term (current) use of aromatase inhibitors: Secondary | ICD-10-CM

## 2015-07-26 NOTE — Progress Notes (Signed)
Patient Care Team: L.Donnie Coffin, MD as PCP - General (Family Medicine) Autumn Messing III, MD as Consulting Physician (General Surgery) Nicholas Lose, MD as Consulting Physician (Hematology and Oncology) Arloa Koh, MD as Consulting Physician (Radiation Oncology) Rockwell Germany, RN as Registered Nurse Mauro Kaufmann, RN as Registered Nurse Holley Bouche, NP as Nurse Practitioner (Nurse Practitioner) Sylvan Cheese, NP as Nurse Practitioner (Hematology and Oncology)  DIAGNOSIS: Breast cancer of lower-outer quadrant of right female breast [facility])   Staging form: Breast, AJCC 7th Edition     Clinical stage from 05/12/2014: Stage IA (T1b, N0, M0) - Unsigned       Staging comments: Staged at breast conference 3.2.16      Pathologic stage from 06/25/2014: Stage IA (T1b, N0, cM0) - Signed by Enid Cutter, MD on 07/21/2014       Staging comments: Staged on final lumpectomy specimen by Dr. Lyndon Code  SUMMARY OF ONCOLOGIC HISTORY:   Breast cancer of lower-outer quadrant of right female breast ([place])   04/05/2014 Mammogram Right breast: possible new masses warranting further imaging   04/19/2014 Breast US Right breast: irregular hypoechoic mass in the 6 o'clock location, 4 cm from the nipple and posterior in location, measuring 0.9 x 0.6 x 0.6 cm. No other findings. No LAD.   04/30/2014 Initial Biopsy Right breast biopsy 6:00: Invasive mammary cancer favor ductal, grade 2, ER+ (96%), PR+ (45%), HER-2 negative (ratio 1.03), Ki-67 49%.   05/11/2014 Breast MRI Right breast 6:00 position: 0.9 x 0.7 x 0.6 cm mass, right breast 1 cm T2 hyperintense enhancing mass suggestive of benign fibroadenoma which has been stable   05/12/2014 Clinical Stage Stage IA: T1b N0   06/24/2014 Definitive Surgery Right lumpectomy/SLNB Marlou Starks): Invasive ductal carcinoma, grade 2, 0.9 cm, 3 LN removed and negative (0/3). DCIS, intermediate grade.    06/24/2014 Pathologic Stage Stage IA: pT1b pN0   06/24/2014 Oncotype testing Oncotype  DX recurrence score 28, (18% ROR)    07/20/2014 - 09/21/2014 Chemotherapy Adjuvant docetaxel and cyclophosphamide q 3 weeks x 4 cycles   11/02/2014 - 12/07/2014 Radiation Therapy Adjuvant RT Valere Dross): Right breast 50 Gy over 25 fractions, no boost   12/25/2014 -  Anti-estrogen oral therapy Anastrozole 1 mg daily.  Planned duration of therapy 5 years.   01/11/2015 Survivorship Survivorship care plan completed and mailed to patient in lieu of in person visit.    CHIEF COMPLIANT: follow-up on anastrozole  INTERVAL HISTORY: Tammy Day is a 65 year old with above-mentioned history of right breast cancer currently on anastrozole therapy and appears to be tolerating it fairly well. She does have occasional hot flashes which she attributes to certain manufacturer off anastrozole. She does not have any myalgias.  REVIEW OF SYSTEMS:   Constitutional: Denies fevers, chills or abnormal weight loss Eyes: Denies blurriness of vision Ears, nose, mouth, throat, and face: Denies mucositis or sore throat Respiratory: Denies cough, dyspnea or wheezes Cardiovascular: Denies palpitation, chest discomfort Gastrointestinal:  Denies nausea, heartburn or change in bowel habits Skin: Denies abnormal skin rashes Lymphatics: Denies new lymphadenopathy or easy bruising Neurological:Denies numbness, tingling or new weaknesses Behavioral/Psych: Mood is stable, no new changes  Extremities: No lower extremity edema Breast:  denies any pain or lumps or nodules in either breasts All other systems were reviewed with the patient and are negative.  I have reviewed the past medical history, past surgical history, social history and family history with the patient and they are unchanged from previous note.  ALLERGIES:  is  allergic to atorvastatin; nexium; other; pravastatin; prilosec; and rabeprazole.  MEDICATIONS:  Current Outpatient Prescriptions  Medication Sig Dispense Refill  . anastrozole (ARIMIDEX) 1 MG tablet  Take 1 tablet (1 mg total) by mouth daily. 90 tablet 3  . Ascorbic Acid 125 MG CHEW Chew by mouth.    Marland Kitchen aspirin 81 MG tablet Take 81 mg by mouth daily.    Marland Kitchen azelastine (ASTELIN) 0.1 % nasal spray Place into both nostrils 2 (two) times daily. Use in each nostril as directed    . Calcium-Phosphorus-Vitamin D (CALCIUM/D3 ADULT GUMMIES PO) Take by mouth.    . cholestyramine (QUESTRAN) 4 GM/DOSE powder Take by mouth daily.    . colesevelam (WELCHOL) 625 MG tablet Take 625 mg by mouth daily. 2 tab in the am, 2 in the pm.    . diphenhydrAMINE (BENADRYL) 25 MG tablet Take 25 mg by mouth every 6 (six) hours as needed.    . fluticasone (FLONASE) 50 MCG/ACT nasal spray   1  . montelukast (SINGULAIR) 10 MG tablet Take 10 mg by mouth at bedtime. As Needed    . Multiple Vitamins-Minerals (ONE-A-DAY VITACRAVES PO) Take by mouth daily.    . Omega-3 Fatty Acids (FISH OIL) 1200 MG CAPS Take by mouth daily.    . RABEprazole (ACIPHEX) 20 MG tablet Take 20 mg by mouth as needed.     . Vaginal Lubricant (REPLENS) GEL Place 1 application vaginally daily.  0   No current facility-administered medications for this visit.    PHYSICAL EXAMINATION: ECOG PERFORMANCE STATUS: 1 - Symptomatic but completely ambulatory  Filed Vitals:   07/26/15 1102  BP: 127/88  Pulse: 78  Temp: 98.5 F (36.9 C)  Resp: 18   Filed Weights   07/26/15 1102  Weight: 168 lb 1.6 oz (76.25 kg)    GENERAL:alert, no distress and comfortable SKIN: skin color, texture, turgor are normal, no rashes or significant lesions EYES: normal, Conjunctiva are pink and non-injected, sclera clear OROPHARYNX:no exudate, no erythema and lips, buccal mucosa, and tongue normal  NECK: supple, thyroid normal size, non-tender, without nodularity LYMPH:  no palpable lymphadenopathy in the cervical, axillary or inguinal LUNGS: clear to auscultation and percussion with normal breathing effort HEART: regular rate & rhythm and no murmurs and no lower  extremity edema ABDOMEN:abdomen soft, non-tender and normal bowel sounds MUSCULOSKELETAL:no cyanosis of digits and no clubbing  NEURO: alert & oriented x 3 with fluent speech, no focal motor/sensory deficits EXTREMITIES: No lower extremity edema BREAST: No palpable masses or nodules in either right or left breasts. No palpable axillary supraclavicular or infraclavicular adenopathy no breast tenderness or nipple discharge. (exam performed in the presence of a chaperone)  LABORATORY DATA:  I have reviewed the data as listed   Chemistry      Component Value Date/Time   NA 141 11/16/2014 0849   K 4.3 11/16/2014 0849   CO2 21* 11/16/2014 0849   BUN 13.9 11/16/2014 0849   CREATININE 0.8 11/16/2014 0849      Component Value Date/Time   CALCIUM 9.4 11/16/2014 0849   ALKPHOS 52 11/16/2014 0849   AST 13 11/16/2014 0849   ALT 17 11/16/2014 0849   BILITOT 0.37 11/16/2014 0849       Lab Results  Component Value Date   WBC 4.0 11/16/2014   HGB 14.0 11/16/2014   HCT 42.4 11/16/2014   MCV 94.2 11/16/2014   PLT 266 11/16/2014   NEUTROABS 3.1 11/16/2014     ASSESSMENT & PLAN:  Breast  cancer of lower-outer quadrant of right female breast Right breast invasive ductal carcinoma, grade 2, 0.9 cm, 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  Profound vaginal dryness: I recommended Replens and coconut oil. We also discussed the  role of vaginal laser therapy be a backup if necessary.  Return to clinic in 6 months for follow-up and breast exams    No orders of the defined types were placed in this encounter.   The patient has a good understanding of the overall plan. she agrees with it. she will call with any problems that may develop before the next visit here.   Rulon Eisenmenger, MD 07/26/2015

## 2015-07-26 NOTE — Telephone Encounter (Signed)
Gave and printed appt sched and avs fo rpt; for NOV  °

## 2015-07-26 NOTE — Assessment & Plan Note (Signed)
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  Profound vaginal dryness: I recommended Replens and coconut oil. We also discussed the role of vaginal laser therapy be a backup if necessary.  Return to clinic in 6 months for follow-up and breast exams

## 2015-12-23 ENCOUNTER — Other Ambulatory Visit: Payer: Self-pay | Admitting: Hematology and Oncology

## 2015-12-23 DIAGNOSIS — C50511 Malignant neoplasm of lower-outer quadrant of right female breast: Secondary | ICD-10-CM

## 2016-01-31 ENCOUNTER — Ambulatory Visit: Payer: Medicare Other | Admitting: Hematology and Oncology

## 2016-02-06 NOTE — Assessment & Plan Note (Signed)
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  Profound vaginal dryness: I recommended Replens and coconut oil. We also discussed the role of vaginal laser therapy be a backup if necessary.  Breast Cancer Surveillance: 1. Breast exam 02/07/16: Normal 2. Mammogram 04/29/15 No abnormalities. Postsurgical changes. Breast Density Category C. I recommended that she get 3-D mammograms for surveillance. Discussed the differences between different breast density categories.    Return to clinic in 6 months for follow-up and breast exams

## 2016-02-07 ENCOUNTER — Encounter: Payer: Self-pay | Admitting: Hematology and Oncology

## 2016-02-07 ENCOUNTER — Ambulatory Visit (HOSPITAL_BASED_OUTPATIENT_CLINIC_OR_DEPARTMENT_OTHER): Payer: Medicare Other | Admitting: Hematology and Oncology

## 2016-02-07 DIAGNOSIS — Z79811 Long term (current) use of aromatase inhibitors: Secondary | ICD-10-CM

## 2016-02-07 DIAGNOSIS — Z170 Estrogen receptor positive status [ER+]: Secondary | ICD-10-CM

## 2016-02-07 DIAGNOSIS — C50511 Malignant neoplasm of lower-outer quadrant of right female breast: Secondary | ICD-10-CM

## 2016-02-07 NOTE — Progress Notes (Signed)
Patient Care Team: L.Donnie Coffin, MD as PCP - General (Family Medicine) Autumn Messing III, MD as Consulting Physician (General Surgery) Nicholas Lose, MD as Consulting Physician (Hematology and Oncology) Arloa Koh, MD as Consulting Physician (Radiation Oncology) Rockwell Germany, RN as Registered Nurse Mauro Kaufmann, RN as Registered Nurse Holley Bouche, NP as Nurse Practitioner (Nurse Practitioner) Sylvan Cheese, NP as Nurse Practitioner (Hematology and Oncology)  DIAGNOSIS:  Encounter Diagnosis  Name Primary?  . Malignant neoplasm of lower-outer quadrant of right breast of female, estrogen receptor positive ([place])     SUMMARY OF ONCOLOGIC HISTORY:   Breast cancer of lower-outer quadrant of right female breast ([place])   04/05/2014 Mammogram    Right breast: possible new masses warranting further imaging      04/19/2014 Breast US    Right breast: irregular hypoechoic mass in the 6 o'clock location, 4 cm from the nipple and posterior in location, measuring 0.9 x 0.6 x 0.6 cm. No other findings. No LAD.      04/30/2014 Initial Biopsy    Right breast biopsy 6:00: Invasive mammary cancer favor ductal, grade 2, ER+ (96%), PR+ (45%), HER-2 negative (ratio 1.03), Ki-67 49%.      05/11/2014 Breast MRI    Right breast 6:00 position: 0.9 x 0.7 x 0.6 cm mass, right breast 1 cm T2 hyperintense enhancing mass suggestive of benign fibroadenoma which has been stable      05/12/2014 Clinical Stage    Stage IA: T1b N0      06/24/2014 Definitive Surgery    Right lumpectomy/[facility]NB Marlou Starks): Invasive ductal carcinoma, grade 2, 0.9 cm, 3 LN removed and negative (0/3). DCIS, intermediate grade.       06/24/2014 Pathologic Stage    Stage IA: pT1b pN0      06/24/2014 Oncotype testing    Oncotype DX recurrence score 28, (18% ROR)       07/20/2014 - 09/21/2014 Chemotherapy    Adjuvant docetaxel and cyclophosphamide q 3 weeks x 4 cycles      11/02/2014 - 12/07/2014 Radiation Therapy   Adjuvant RT Valere Dross): Right breast 50 Gy over 25 fractions, no boost      12/25/2014 -  Anti-estrogen oral therapy    Anastrozole 1 mg daily.  Planned duration of therapy 5 years.      01/11/2015 Survivorship    Survivorship care plan completed and mailed to patient in lieu of in person visit.       CHIEF COMPLIANT: Follow-up on anastrozole therapy  INTERVAL HISTORY: Tammy Day is a 65 year old with above-mentioned history of right breast cancer treated with lumpectomy and adjuvant chemotherapy and radiation and is currently on oral antiestrogen therapy with anastrozole. She has had problems with vaginal dryness related to antiestrogen therapy. This has improved by the use of Replens. She Denies hot flashes or myalgias and arthralgias. She denies any lumps or nodules in breast.  REVIEW OF SYSTEMS:   Constitutional: Denies fevers, chills or abnormal weight loss Eyes: Denies blurriness of vision Ears, nose, mouth, throat, and face: Denies mucositis or sore throat Respiratory: Denies cough, dyspnea or wheezes Cardiovascular: Denies palpitation, chest discomfort Gastrointestinal:  Denies nausea, heartburn or change in bowel habits Skin: Denies abnormal skin rashes Lymphatics: Denies new lymphadenopathy or easy bruising Neurological:Denies numbness, tingling or new weaknesses Behavioral/Psych: Mood is stable, no new changes  Extremities: No lower extremity edema Breast:  denies any pain or lumps or nodules in either breasts All other systems were reviewed with the patient and  are negative.  I have reviewed the past medical history, past surgical history, social history and family history with the patient and they are unchanged from previous note.  ALLERGIES:  is allergic to atorvastatin; nexium [esomeprazole magnesium]; other; pravastatin; prilosec [omeprazole]; and rabeprazole.  MEDICATIONS:  Current Outpatient Prescriptions  Medication Sig Dispense Refill  . anastrozole  (ARIMIDEX) 1 MG tablet take 1 tablet by mouth once daily 90 tablet 0  . Ascorbic Acid 125 MG CHEW Chew by mouth.    Marland Kitchen aspirin 81 MG tablet Take 81 mg by mouth daily.    Marland Kitchen azelastine (ASTELIN) 0.1 % nasal spray Place into both nostrils 2 (two) times daily. Use in each nostril as directed    . Calcium-Phosphorus-Vitamin D (CALCIUM/D3 ADULT GUMMIES PO) Take by mouth.    . cholestyramine (QUESTRAN) 4 GM/DOSE powder Take by mouth daily.    . colesevelam (WELCHOL) 625 MG tablet Take 625 mg by mouth daily. 2 tab in the am, 2 in the pm.    . diphenhydrAMINE (BENADRYL) 25 MG tablet Take 25 mg by mouth every 6 (six) hours as needed.    . fluticasone (FLONASE) 50 MCG/ACT nasal spray   1  . montelukast (SINGULAIR) 10 MG tablet Take 10 mg by mouth at bedtime. As Needed    . Multiple Vitamins-Minerals (ONE-A-DAY VITACRAVES PO) Take by mouth daily.    . Omega-3 Fatty Acids (FISH OIL) 1200 MG CAPS Take by mouth daily.    . RABEprazole (ACIPHEX) 20 MG tablet Take 20 mg by mouth as needed.     . Vaginal Lubricant (REPLENS) GEL Place 1 application vaginally daily.  0   No current facility-administered medications for this visit.     PHYSICAL EXAMINATION: ECOG PERFORMANCE STATUS: 1 - Symptomatic but completely ambulatory  Vitals:   02/07/16 0831  BP: 126/80  Pulse: 79  Resp: 18  Temp: 98 F (36.7 C)   Filed Weights   02/07/16 0831  Weight: 164 lb 14.4 oz (74.8 kg)    GENERAL:alert, no distress and comfortable SKIN: skin color, texture, turgor are normal, no rashes or significant lesions EYES: normal, Conjunctiva are pink and non-injected, sclera clear OROPHARYNX:no exudate, no erythema and lips, buccal mucosa, and tongue normal  NECK: supple, thyroid normal size, non-tender, without nodularity LYMPH:  no palpable lymphadenopathy in the cervical, axillary or inguinal LUNGS: clear to auscultation and percussion with normal breathing effort HEART: regular rate & rhythm and no murmurs and no lower  extremity edema ABDOMEN:abdomen soft, non-tender and normal bowel sounds MUSCULOSKELETAL:no cyanosis of digits and no clubbing  NEURO: alert & oriented x 3 with fluent speech, no focal motor/sensory deficits EXTREMITIES: No lower extremity edema BREAST: No palpable masses or nodules in either right or left breasts. No palpable axillary supraclavicular or infraclavicular adenopathy no breast tenderness or nipple discharge. (exam performed in the presence of a chaperone)  LABORATORY DATA:  I have reviewed the data as listed   Chemistry      Component Value Date/Time   NA 141 11/16/2014 0849   K 4.3 11/16/2014 0849   CO2 21 (L) 11/16/2014 0849   BUN 13.9 11/16/2014 0849   CREATININE 0.8 11/16/2014 0849      Component Value Date/Time   CALCIUM 9.4 11/16/2014 0849   ALKPHOS 52 11/16/2014 0849   AST 13 11/16/2014 0849   ALT 17 11/16/2014 0849   BILITOT 0.37 11/16/2014 0849       Lab Results  Component Value Date   WBC 4.0 11/16/2014  HGB 14.0 11/16/2014   HCT 42.4 11/16/2014   MCV 94.2 11/16/2014   PLT 266 11/16/2014   NEUTROABS 3.1 11/16/2014    ASSESSMENT & PLAN:  Breast cancer of lower-outer quadrant of right female breast Right breast invasive ductal carcinoma, grade 2, 0.9 cm, 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  Profound vaginal dryness: I recommended Replens and coconut oil. We also discussed  the role of vaginal laser therapy be a backup if necessary.  Breast Cancer Surveillance: 1. Breast exam 02/07/16: Normal 2. Mammogram 04/29/15 No abnormalities. Postsurgical changes. Breast Density Category C. I recommended that she get 3-D mammograms for surveillance. Discussed the differences between different breast density categories. I filled paperwork for a cancer policy.   Return to clinic in 1 year for follow-up  No orders of the defined types were placed in this encounter.  The patient has a good understanding of the overall plan. she agrees with it. she will call with any problems that may develop before the next visit here.   Rulon Eisenmenger, MD 02/07/16

## 2016-03-30 ENCOUNTER — Other Ambulatory Visit: Payer: Self-pay

## 2016-03-30 DIAGNOSIS — C50511 Malignant neoplasm of lower-outer quadrant of right female breast: Secondary | ICD-10-CM

## 2016-03-30 MED ORDER — ANASTROZOLE 1 MG PO TABS: 1.0000 mg | ORAL_TABLET | Freq: Every day | ORAL | 0 refills | Status: DC

## 2016-03-31 ENCOUNTER — Other Ambulatory Visit: Payer: Self-pay | Admitting: Nurse Practitioner

## 2016-05-10 ENCOUNTER — Other Ambulatory Visit: Payer: Self-pay | Admitting: Family Medicine

## 2016-05-10 DIAGNOSIS — Z853 Personal history of malignant neoplasm of breast: Secondary | ICD-10-CM

## 2016-05-14 ENCOUNTER — Ambulatory Visit
Admission: RE | Admit: 2016-05-14 | Discharge: 2016-05-14 | Disposition: A | Discharge status: Home / Self Care | Payer: Medicare Other | Source: Ambulatory Visit | Attending: Family Medicine | Admitting: Family Medicine

## 2016-05-14 DIAGNOSIS — Z853 Personal history of malignant neoplasm of breast: Secondary | ICD-10-CM

## 2016-05-14 HISTORY — DX: Personal history of irradiation: Z92.3

## 2016-05-14 HISTORY — DX: Personal history of antineoplastic chemotherapy: Z92.21

## 2016-05-19 IMAGING — MG MM SCREENING BREAST TOMO BILATERAL
11 of 14 series · 11 of 30 positions shown · non-contrast
Comparison: Previous exam(s)

CLINICAL DATA: Screening.

EXAM:
DIGITAL SCREENING BILATERAL MAMMOGRAM WITH 3D TOMO WITH CAD

[L MLO (1 of 2)]
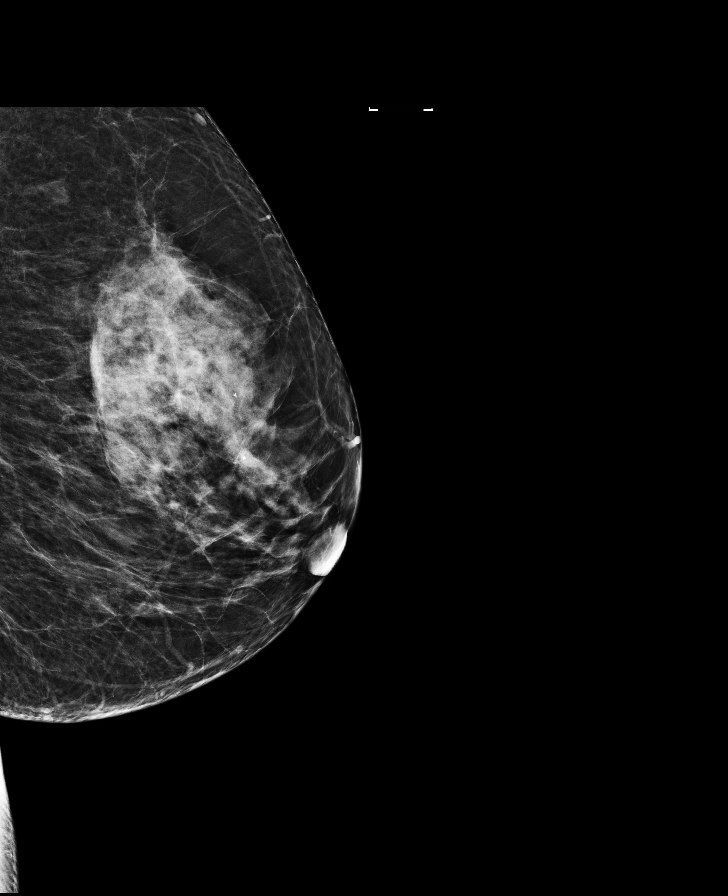

[R MLO (1 of 2)]
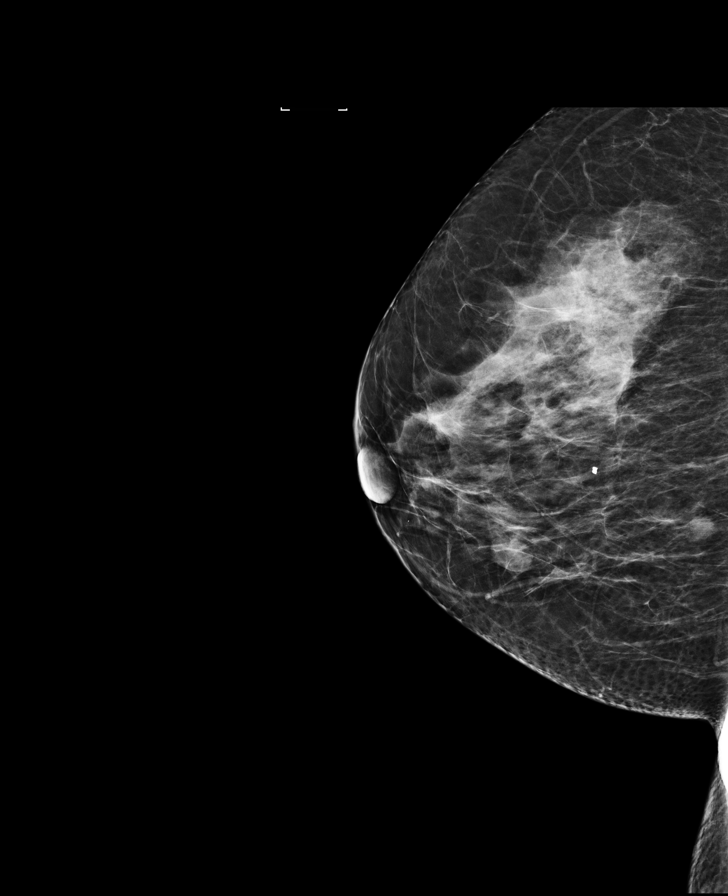

[R CC]
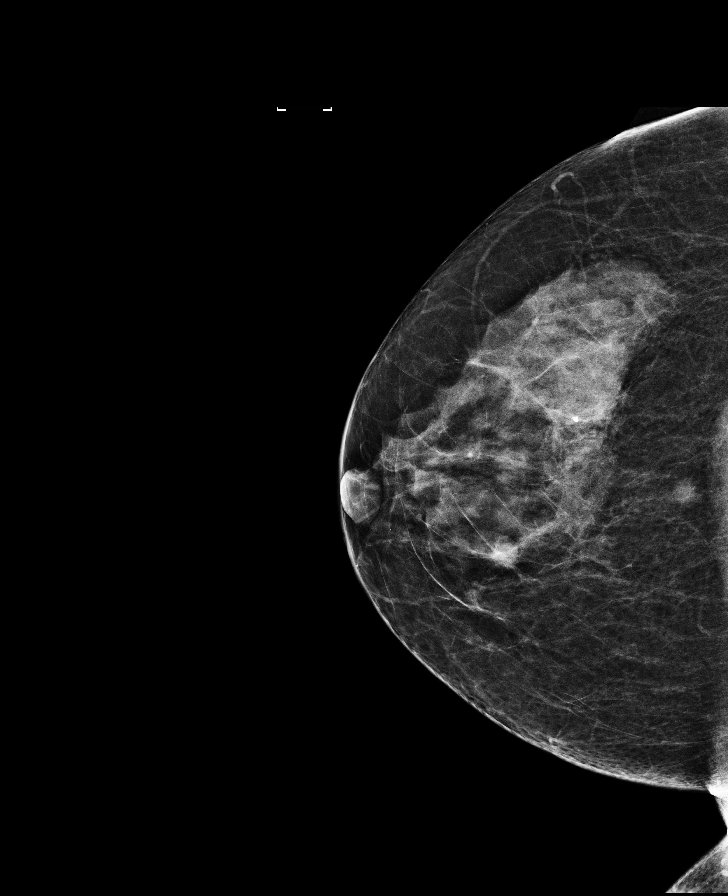

[R MLO (2 of 2)]
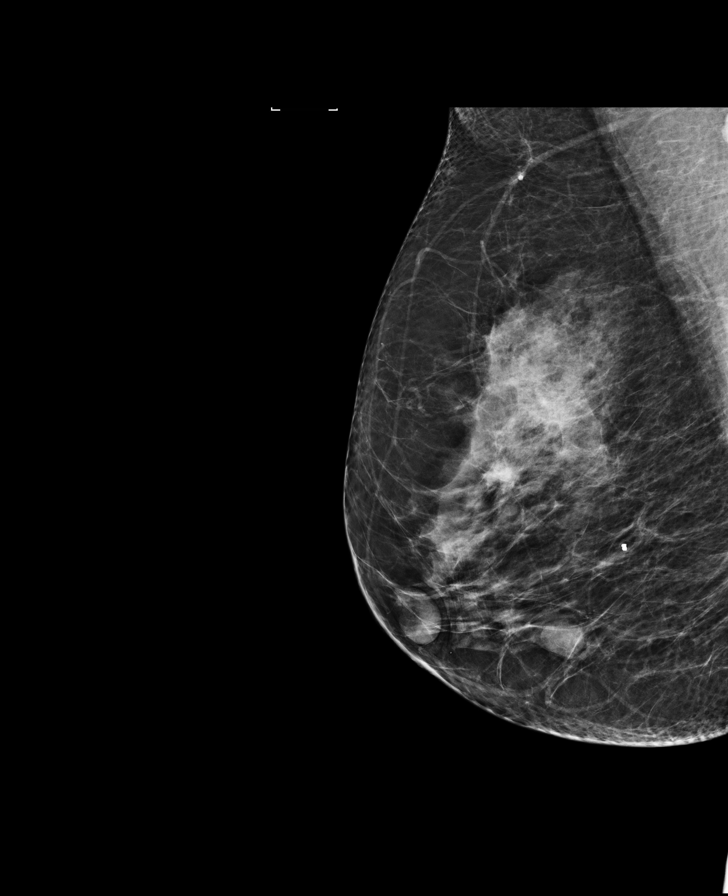

[L MLO (2 of 2)]
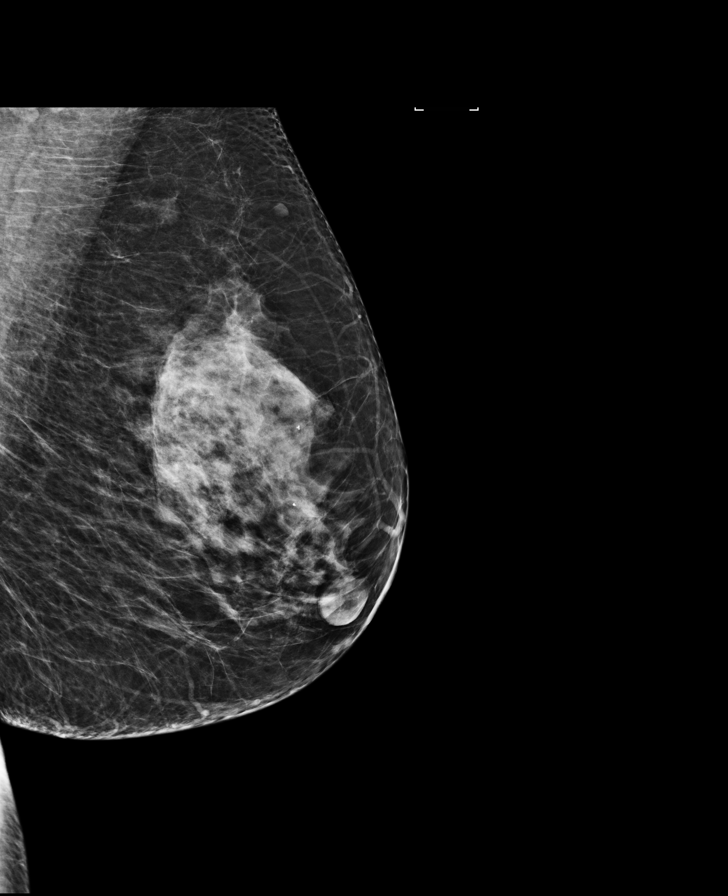

[L CC]
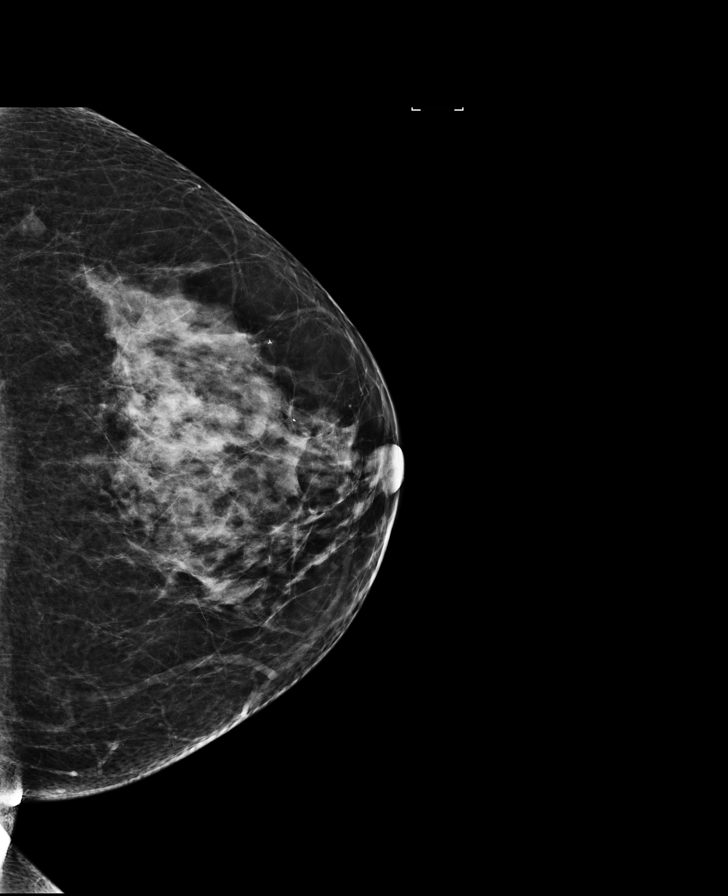

[R MLO tomo]
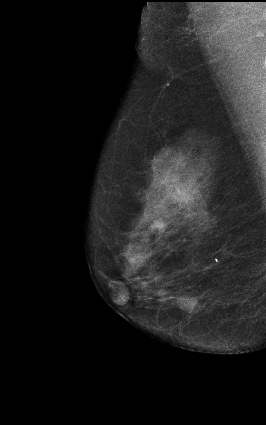

[L MLO tomo (1 of 2)]
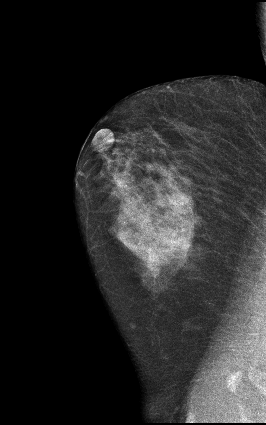

[L CC tomo]
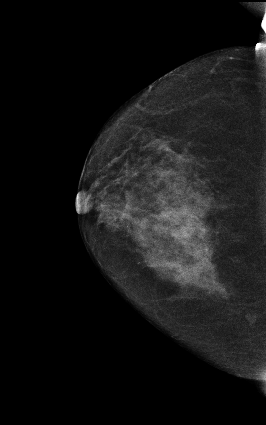

[R CC tomo]
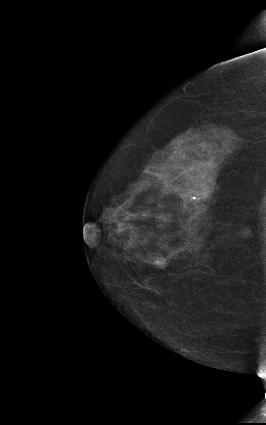

[L MLO tomo (2 of 2) · tomo slice 34/67.0]
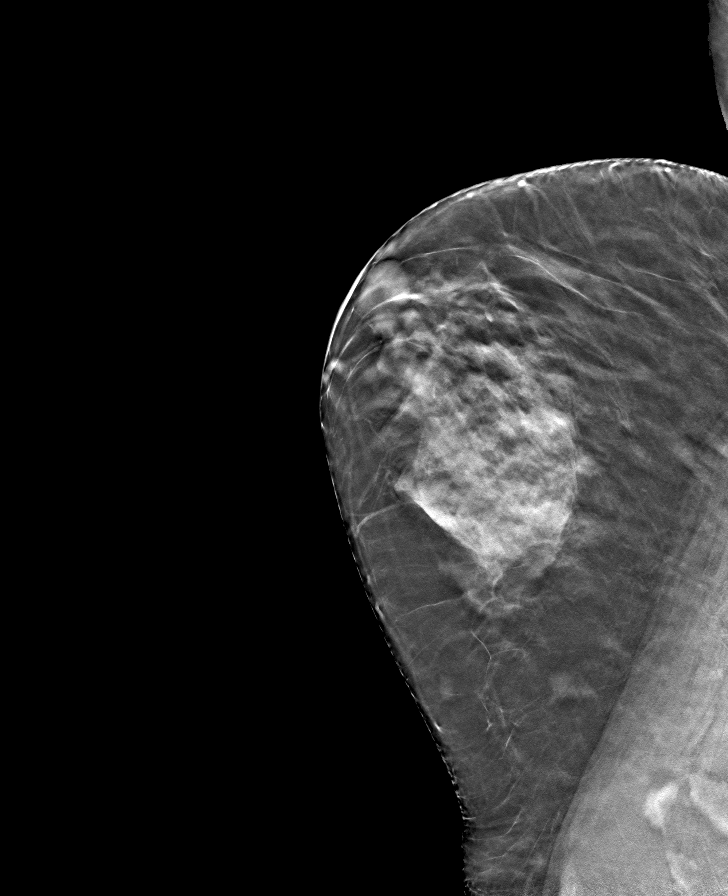

[11 of 30 positions shown; findings below may reference images not displayed]

ACR Breast Density Category c: The breast tissue is heterogeneously
dense, which may obscure small masses.
FINDINGS: In the right breast, a possible masses warrant further evaluation
with spot compression views and possibly ultrasound. In the left
breast, no findings suspicious for malignancy. Images were processed
with CAD.
IMPRESSION: Further evaluation is suggested for possible masses in the right
breast.

RECOMMENDATION:
Diagnostic mammogram and possibly ultrasound of the right breast.
(Code:[id number])

The patient will be contacted regarding the findings, and additional
imaging will be scheduled.

BI-RADS CATEGORY  0: Incomplete. Need additional imaging evaluation
and/or prior mammograms for comparison.

## 2016-05-22 ENCOUNTER — Other Ambulatory Visit: Payer: Self-pay | Admitting: Hematology and Oncology

## 2016-05-22 DIAGNOSIS — C50511 Malignant neoplasm of lower-outer quadrant of right female breast: Secondary | ICD-10-CM

## 2016-05-24 ENCOUNTER — Telehealth: Payer: Self-pay | Admitting: *Deleted

## 2016-05-24 NOTE — Telephone Encounter (Signed)
Call from patient reporting she is out of anastrozole.  Would like order sent.  Order was sent on 05-22-2016.Marland Kitchen  Spoke with [employer] (Soon to be [employer]).  Report order on hold due to being forty days early insurance will not allow.  Patient notified.  Will search her home and call the office back.  [employer] notified.

## 2016-08-08 ENCOUNTER — Other Ambulatory Visit: Payer: Self-pay

## 2016-08-08 MED ORDER — ANASTROZOLE 1 MG PO TABS: 1.0000 mg | ORAL_TABLET | Freq: Every day | ORAL | 0 refills | Status: DC

## 2016-08-08 NOTE — Progress Notes (Signed)
Pt lvm regarding request to change her anastrozole to the brand name arimidex. Pt states that she cannot tolerate the generic brand. [place] and they are requesting to have a new brand name order sent in. Will send for pt today.

## 2016-11-28 ENCOUNTER — Other Ambulatory Visit: Payer: Self-pay | Admitting: Hematology and Oncology

## 2017-01-07 ENCOUNTER — Telehealth: Payer: Self-pay | Admitting: *Deleted

## 2017-01-07 ENCOUNTER — Other Ambulatory Visit: Payer: Self-pay

## 2017-01-07 DIAGNOSIS — C50511 Malignant neoplasm of lower-outer quadrant of right female breast: Secondary | ICD-10-CM

## 2017-01-07 DIAGNOSIS — Z171 Estrogen receptor negative status [ER-]: Principal | ICD-10-CM

## 2017-01-07 MED ORDER — ANASTROZOLE 1 MG PO TABS: 1.0000 mg | ORAL_TABLET | Freq: Every day | ORAL | 1 refills | Status: DC

## 2017-01-07 NOTE — Telephone Encounter (Signed)
This is [facility] at [address] for a refill of a patient of Dr. Geralyn Flash.  She needs Anastrozole refilled." Authorized refill with this call.

## 2017-02-06 ENCOUNTER — Telehealth: Payer: Self-pay | Admitting: Hematology and Oncology

## 2017-02-06 ENCOUNTER — Ambulatory Visit (HOSPITAL_BASED_OUTPATIENT_CLINIC_OR_DEPARTMENT_OTHER): Payer: Medicare Other | Admitting: Hematology and Oncology

## 2017-02-06 DIAGNOSIS — Z171 Estrogen receptor negative status [ER-]: Secondary | ICD-10-CM

## 2017-02-06 DIAGNOSIS — Z170 Estrogen receptor positive status [ER+]: Secondary | ICD-10-CM | POA: Diagnosis not present

## 2017-02-06 DIAGNOSIS — Z79811 Long term (current) use of aromatase inhibitors: Secondary | ICD-10-CM | POA: Diagnosis not present

## 2017-02-06 DIAGNOSIS — C50511 Malignant neoplasm of lower-outer quadrant of right female breast: Secondary | ICD-10-CM | POA: Diagnosis not present

## 2017-02-06 MED ORDER — ANASTROZOLE 1 MG PO TABS: 1.0000 mg | ORAL_TABLET | Freq: Every day | ORAL | 3 refills | Status: DC

## 2017-02-06 NOTE — Telephone Encounter (Signed)
Gave patient AVS and calendar of upcoming November 2019 appointments.

## 2017-02-06 NOTE — Assessment & Plan Note (Signed)
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  Profound vaginal dryness: I recommended Replens and coconut oil. We also discussed the role of vaginal laser therapy be a backup if necessary.  Breast Cancer Surveillance: 1. Breast exam 02/06/17: Normal 2. Mammogram 05/14/16 No abnormalities. Postsurgical changes. Breast Density Category C.   Return to clinic in 1 year for follow-up

## 2017-02-06 NOTE — Progress Notes (Signed)
Patient Care Team: Alroy Dust, L.Marlou Sa, MD as PCP - General (Family Medicine) Jovita Kussmaul, MD as Consulting Physician (General Surgery) Nicholas Lose, MD as Consulting Physician (Hematology and Oncology) Arloa Koh, MD as Consulting Physician (Radiation Oncology) Rockwell Germany, RN as Registered Nurse Mauro Kaufmann, RN as Registered Nurse Holley Bouche, NP as Nurse Practitioner (Nurse Practitioner) Sylvan Cheese, NP as Nurse Practitioner (Hematology and Oncology)  DIAGNOSIS:  Encounter Diagnosis  Name Primary?  . Malignant neoplasm of lower-outer quadrant of right breast of female, estrogen receptor positive ([place])     SUMMARY OF ONCOLOGIC HISTORY:   Breast cancer of lower-outer quadrant of right female breast ([place])   04/05/2014 Mammogram    Right breast: possible new masses warranting further imaging      04/19/2014 Breast US    Right breast: irregular hypoechoic mass in the 6 o'clock location, 4 cm from the nipple and posterior in location, measuring 0.9 x 0.6 x 0.6 cm. No other findings. No LAD.      04/30/2014 Initial Biopsy    Right breast biopsy 6:00: Invasive mammary cancer favor ductal, grade 2, ER+ (96%), PR+ (45%), HER-2 negative (ratio 1.03), Ki-67 49%.      05/11/2014 Breast MRI    Right breast 6:00 position: 0.9 x 0.7 x 0.6 cm mass, right breast 1 cm T2 hyperintense enhancing mass suggestive of benign fibroadenoma which has been stable      05/12/2014 Clinical Stage    Stage IA: T1b N0      06/24/2014 Definitive Surgery    Right lumpectomy/SLNB Marlou Starks): Invasive ductal carcinoma, grade 2, 0.9 cm, 3 LN removed and negative (0/3). DCIS, intermediate grade.       06/24/2014 Pathologic Stage    Stage IA: pT1b pN0      06/24/2014 Oncotype testing    Oncotype DX recurrence score 28, (18% ROR)       07/20/2014 - 09/21/2014 Chemotherapy    Adjuvant docetaxel and cyclophosphamide q 3 weeks x 4 cycles      11/02/2014 - 12/07/2014 Radiation Therapy      Adjuvant RT Valere Dross): Right breast 50 Gy over 25 fractions, no boost      12/25/2014 -  Anti-estrogen oral therapy    Anastrozole 1 mg daily.  Planned duration of therapy 5 years.      01/11/2015 Survivorship    Survivorship care plan completed and mailed to patient in lieu of in person visit.       CHIEF COMPLIANT: Annual follow-up of breast cancer on anastrozole  INTERVAL HISTORY: Tammy Day is a 66 year old with above-mentioned history of right breast cancer currently on anastrozole therapy and appears to be tolerating it fairly well.  She had profound side effects when that switched her generic brand to a different manufacturer.  To figure it out and got the right brand and since then she has been doing quite well.  She has very occasional hot flashes.  Denies any arthralgias.  Denies any lumps or nodules in the breast.  REVIEW OF SYSTEMS:   Constitutional: Denies fevers, chills or abnormal weight loss Eyes: Denies blurriness of vision Ears, nose, mouth, throat, and face: Denies mucositis or sore throat Respiratory: Denies cough, dyspnea or wheezes Cardiovascular: Denies palpitation, chest discomfort Gastrointestinal:  Denies nausea, heartburn or change in bowel habits Skin: Denies abnormal skin rashes Lymphatics: Denies new lymphadenopathy or easy bruising Neurological:Denies numbness, tingling or new weaknesses Behavioral/Psych: Mood is stable, no new changes  Extremities: No lower extremity edema  Breast:  denies any pain or lumps or nodules in either breasts All other systems were reviewed with the patient and are negative.  I have reviewed the past medical history, past surgical history, social history and family history with the patient and they are unchanged from previous note.  ALLERGIES:  is allergic to atorvastatin; nexium [esomeprazole magnesium]; other; pravastatin; prilosec [omeprazole]; and rabeprazole.  MEDICATIONS:  Current Outpatient Medications   Medication Sig Dispense Refill  . anastrozole (ARIMIDEX) 1 MG tablet Take 1 tablet (1 mg total) by mouth daily. 30 tablet 1  . Ascorbic Acid 125 MG CHEW Chew by mouth.    Marland Kitchen aspirin 81 MG tablet Take 81 mg by mouth daily.    Marland Kitchen azelastine (ASTELIN) 0.1 % nasal spray Place into both nostrils 2 (two) times daily as needed. Use in each nostril as directed     . Calcium-Phosphorus-Vitamin D (CALCIUM/D3 ADULT GUMMIES PO) Take by mouth.    . cholestyramine (QUESTRAN) 4 GM/DOSE powder Take by mouth daily.    . colesevelam (WELCHOL) 625 MG tablet Take 625 mg by mouth daily. 2 tab in the am, 2 in the pm.    . diphenhydrAMINE (BENADRYL) 25 MG tablet Take 25 mg by mouth every 6 (six) hours as needed.    . fluticasone (FLONASE) 50 MCG/ACT nasal spray   1  . montelukast (SINGULAIR) 10 MG tablet Take 10 mg by mouth at bedtime. As Needed    . Multiple Vitamins-Minerals (ONE-A-DAY VITACRAVES PO) Take by mouth daily.    . Omega-3 Fatty Acids (FISH OIL) 1200 MG CAPS Take by mouth daily.    . RABEprazole (ACIPHEX) 20 MG tablet Take 20 mg by mouth as needed.     . rosuvastatin (CRESTOR) 10 MG tablet Take 10 mg by mouth 3 (three) times a week. Monday, Wednesday,Friday    . Vaginal Lubricant (REPLENS) GEL Place 1 application vaginally daily.  0   No current facility-administered medications for this visit.     PHYSICAL EXAMINATION: ECOG PERFORMANCE STATUS: 1 - Symptomatic but completely ambulatory  There were no vitals filed for this visit. There were no vitals filed for this visit.  GENERAL:alert, no distress and comfortable SKIN: skin color, texture, turgor are normal, no rashes or significant lesions EYES: normal, Conjunctiva are pink and non-injected, sclera clear OROPHARYNX:no exudate, no erythema and lips, buccal mucosa, and tongue normal  NECK: supple, thyroid normal size, non-tender, without nodularity LYMPH:  no palpable lymphadenopathy in the cervical, axillary or inguinal LUNGS: clear to  auscultation and percussion with normal breathing effort HEART: regular rate & rhythm and no murmurs and no lower extremity edema ABDOMEN:abdomen soft, non-tender and normal bowel sounds MUSCULOSKELETAL:no cyanosis of digits and no clubbing  NEURO: alert & oriented x 3 with fluent speech, no focal motor/sensory deficits EXTREMITIES: No lower extremity edema BREAST: No palpable masses or nodules in either right or left breasts. No palpable axillary supraclavicular or infraclavicular adenopathy no breast tenderness or nipple discharge. (exam performed in the presence of a chaperone)  LABORATORY DATA:  I have reviewed the data as listed   Chemistry      Component Value Date/Time   NA 141 11/16/2014 0849   K 4.3 11/16/2014 0849   CO2 21 (L) 11/16/2014 0849   BUN 13.9 11/16/2014 0849   CREATININE 0.8 11/16/2014 0849      Component Value Date/Time   CALCIUM 9.4 11/16/2014 0849   ALKPHOS 52 11/16/2014 0849   AST 13 11/16/2014 0849   ALT 17  11/16/2014 0849   BILITOT 0.37 11/16/2014 0849       Lab Results  Component Value Date   WBC 4.0 11/16/2014   HGB 14.0 11/16/2014   HCT 42.4 11/16/2014   MCV 94.2 11/16/2014   PLT 266 11/16/2014   NEUTROABS 3.1 11/16/2014    ASSESSMENT & PLAN:  Breast cancer of lower-outer quadrant of right female breast Right breast invasive ductal carcinoma, grade 2, 0.9 cm, 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  Profound vaginal dryness: Replens is working very well for her.  Breast Cancer Surveillance: 1. Breast exam 02/06/17: Normal 2. Mammogram 05/14/16 No abnormalities. Postsurgical changes. Breast Density Category C.   Patient is not doing exercise but is going to yoga once a week  Return to clinic in 1 year for follow-up  I spent 25 minutes talking to the patient of which more than half was spent in counseling and coordination of care.  No orders of the defined types were placed in this encounter.  The patient has a good understanding of the overall plan. she agrees with it. she will call with any problems that may develop before the next visit here.   Rulon Eisenmenger, MD 02/06/17

## 2017-06-20 ENCOUNTER — Other Ambulatory Visit: Payer: Self-pay | Admitting: Hematology and Oncology

## 2017-06-20 DIAGNOSIS — Z9889 Other specified postprocedural states: Secondary | ICD-10-CM

## 2017-07-15 ENCOUNTER — Other Ambulatory Visit: Payer: Self-pay

## 2017-07-15 DIAGNOSIS — D229 Melanocytic nevi, unspecified: Secondary | ICD-10-CM

## 2017-07-15 HISTORY — DX: Melanocytic nevi, unspecified: D22.9

## 2017-07-17 ENCOUNTER — Ambulatory Visit
Admission: RE | Admit: 2017-07-17 | Discharge: 2017-07-17 | Disposition: A | Discharge status: Home / Self Care | Payer: Medicare Other | Source: Ambulatory Visit | Attending: Hematology and Oncology | Admitting: Hematology and Oncology

## 2017-07-17 DIAGNOSIS — Z9889 Other specified postprocedural states: Secondary | ICD-10-CM

## 2017-07-17 HISTORY — DX: Malignant neoplasm of unspecified site of unspecified female breast: C50.919

## 2017-09-26 ENCOUNTER — Other Ambulatory Visit: Payer: Self-pay

## 2017-09-26 ENCOUNTER — Encounter: Payer: Self-pay | Admitting: Hematology and Oncology

## 2017-09-26 DIAGNOSIS — Z171 Estrogen receptor negative status [ER-]: Principal | ICD-10-CM

## 2017-09-26 DIAGNOSIS — C50511 Malignant neoplasm of lower-outer quadrant of right female breast: Secondary | ICD-10-CM

## 2017-09-26 MED ORDER — ANASTROZOLE 1 MG PO TABS: 1.0000 mg | ORAL_TABLET | Freq: Every day | ORAL | 1 refills | Status: DC

## 2017-09-26 MED FILL — ANASTROZOLE 1 MG TABLET: 1 | 90 days supply | Qty: 90 | Fill #0

## 2017-09-26 NOTE — Telephone Encounter (Signed)
Returned patient's call regarding she was unable to get her Anastrozole at her pharmacy because they were on back-order.  Called [facility] and they have plenty in stock per rep.  Called pt back and she is fine with picking up her prescription there instead.  Gave her directions. No other needs per pt at this time.

## 2017-12-25 MED FILL — ANASTROZOLE 1 MG TABLET: 1 | 90 days supply | Qty: 90 | Fill #1

## 2018-01-28 ENCOUNTER — Telehealth: Payer: Self-pay | Admitting: Hematology and Oncology

## 2018-01-28 ENCOUNTER — Inpatient Hospital Stay: Payer: Medicare Other | Attending: Hematology and Oncology | Admitting: Hematology and Oncology

## 2018-01-28 DIAGNOSIS — Z7982 Long term (current) use of aspirin: Secondary | ICD-10-CM | POA: Diagnosis not present

## 2018-01-28 DIAGNOSIS — Z170 Estrogen receptor positive status [ER+]: Secondary | ICD-10-CM | POA: Diagnosis not present

## 2018-01-28 DIAGNOSIS — C50511 Malignant neoplasm of lower-outer quadrant of right female breast: Secondary | ICD-10-CM | POA: Insufficient documentation

## 2018-01-28 DIAGNOSIS — Z171 Estrogen receptor negative status [ER-]: Secondary | ICD-10-CM

## 2018-01-28 DIAGNOSIS — Z923 Personal history of irradiation: Secondary | ICD-10-CM

## 2018-01-28 DIAGNOSIS — Z79899 Other long term (current) drug therapy: Secondary | ICD-10-CM | POA: Diagnosis not present

## 2018-01-28 DIAGNOSIS — Z79811 Long term (current) use of aromatase inhibitors: Secondary | ICD-10-CM | POA: Diagnosis not present

## 2018-01-28 DIAGNOSIS — Z9221 Personal history of antineoplastic chemotherapy: Secondary | ICD-10-CM | POA: Diagnosis not present

## 2018-01-28 MED ORDER — ANASTROZOLE 1 MG PO TABS: 1.0000 mg | ORAL_TABLET | Freq: Every day | ORAL | 3 refills | Status: DC

## 2018-01-28 NOTE — Telephone Encounter (Signed)
Gave patient avs and calendar.   °

## 2018-01-28 NOTE — Assessment & Plan Note (Addendum)
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  She uses it occasionally  Breast Cancer Surveillance: 1. Breast exam11/19/2019: Normal 2. Mammogram5/9/2019no abnormalities. Postsurgical changes. Breast Density Category C.  Patient is not doing exercise but is going to yoga once a week  Return to clinic in1 year for follow-up

## 2018-01-28 NOTE — Progress Notes (Signed)
Patient Care Team: Alroy Dust, L.Marlou Sa, MD as PCP - General (Family Medicine) Jovita Kussmaul, MD as Consulting Physician (General Surgery) Nicholas Lose, MD as Consulting Physician (Hematology and Oncology) Arloa Koh, MD as Consulting Physician (Radiation Oncology) Rockwell Germany, RN as Registered Nurse Mauro Kaufmann, RN as Registered Nurse Holley Bouche, NP (Inactive) as Nurse Practitioner (Nurse Practitioner) Sylvan Cheese, NP as Nurse Practitioner (Hematology and Oncology)  DIAGNOSIS:    ICD-10-CM   1. Malignant neoplasm of lower-outer quadrant of right breast of female, estrogen receptor positive ([place]) C50.511    Z17.0   2. Malignant neoplasm of lower-outer quadrant of right breast of female, estrogen receptor negative ([facility]) C50.511 anastrozole (ARIMIDEX) 1 MG tablet   Z17.1     SUMMARY OF ONCOLOGIC HISTORY:   Breast cancer of lower-outer quadrant of right female breast ([place])   04/05/2014 Mammogram    Right breast: possible new masses warranting further imaging    04/19/2014 Breast US    Right breast: irregular hypoechoic mass in the 6 o'clock location, 4 cm from the nipple and posterior in location, measuring 0.9 x 0.6 x 0.6 cm. No other findings. No LAD.    04/30/2014 Initial Biopsy    Right breast biopsy 6:00: Invasive mammary cancer favor ductal, grade 2, ER+ (96%), PR+ (45%), HER-2 negative (ratio 1.03), Ki-67 49%.    05/11/2014 Breast MRI    Right breast 6:00 position: 0.9 x 0.7 x 0.6 cm mass, right breast 1 cm T2 hyperintense enhancing mass suggestive of benign fibroadenoma which has been stable    05/12/2014 Clinical Stage    Stage IA: T1b N0    06/24/2014 Definitive Surgery    Right lumpectomy/SLNB Marlou Starks): Invasive ductal carcinoma, grade 2, 0.9 cm, 3 LN removed and negative (0/3). DCIS, intermediate grade.     06/24/2014 Pathologic Stage    Stage IA: pT1b pN0    06/24/2014 Oncotype testing    Oncotype DX recurrence score 28, (18% ROR)     07/20/2014 - 09/21/2014 Chemotherapy    Adjuvant docetaxel and cyclophosphamide q 3 weeks x 4 cycles    11/02/2014 - 12/07/2014 Radiation Therapy    Adjuvant RT Valere Dross): Right breast 50 Gy over 25 fractions, no boost    12/25/2014 -  Anti-estrogen oral therapy    Anastrozole 1 mg daily.  Planned duration of therapy 5 years.    01/11/2015 Survivorship    Survivorship care plan completed and mailed to patient in lieu of in person visit.     CHIEF COMPLIANT: Annual follow-up of breast cancer on anastrozole  INTERVAL HISTORY: Tammy Day is a 67 y.o. with above-mentioned history of rigth breast cancer treated with lumpectomy, adjuvant chemo and radation and is currently on anti-estrogen therapy with anastrozole.  She was last seen by me 1 year ago. She had a mammogram in 07/2017 which showed no evidence of malignancy. She presents to the clinic today alone. A generic anastrozole caused her menopausal symptoms to return. After returning to her usual anastrozole, her symptoms resolved. She is tolerating anastrozole well. Hot flashes are rare, she has vaginal dryness, for which she uses Replens as needed. I reviewed her medication list with her. She notes she is actively doing yoga.   REVIEW OF SYSTEMS:   Constitutional: Denies fevers, chills or abnormal weight loss (+) hot flashes, rare  Eyes: Denies blurriness of vision Ears, nose, mouth, throat, and face: Denies mucositis or sore throat Respiratory: Denies cough, dyspnea or wheezes Cardiovascular: Denies palpitation, chest discomfort  Gastrointestinal:  Denies nausea, heartburn or change in bowel habits Skin: Denies abnormal skin rashes Lymphatics: Denies new lymphadenopathy or easy bruising Neurological:Denies numbness, tingling or new weaknesses Behavioral/Psych: Mood is stable, no new changes  Extremities: No lower extremity edema UI: (+) vaginal dryness Breast: denies any pain or lumps or nodules in either breasts All other systems  were reviewed with the patient and are negative.  I have reviewed the past medical history, past surgical history, social history and family history with the patient and they are unchanged from previous note.  ALLERGIES:  is allergic to atorvastatin; nexium [esomeprazole magnesium]; other; pravastatin; prilosec [omeprazole]; and rabeprazole.  MEDICATIONS:  Current Outpatient Medications  Medication Sig Dispense Refill  . anastrozole (ARIMIDEX) 1 MG tablet Take 1 tablet (1 mg total) by mouth daily. 90 tablet 3  . Ascorbic Acid 125 MG CHEW Chew by mouth.    Marland Kitchen aspirin 81 MG tablet Take 81 mg by mouth daily.    Marland Kitchen azelastine (ASTELIN) 0.1 % nasal spray Place into both nostrils 2 (two) times daily as needed. Use in each nostril as directed     . Calcium-Phosphorus-Vitamin D (CALCIUM/D3 ADULT GUMMIES PO) Take by mouth.    . Multiple Vitamins-Minerals (ONE-A-DAY VITACRAVES PO) Take by mouth daily.    . Omega-3 Fatty Acids (FISH OIL) 1200 MG CAPS Take by mouth daily.    . RABEprazole (ACIPHEX) 20 MG tablet Take 20 mg by mouth as needed.     . rosuvastatin (CRESTOR) 10 MG tablet Take 10 mg by mouth 3 (three) times a week. Monday, Wednesday,Friday    . Vaginal Lubricant (REPLENS) GEL Place 1 application vaginally daily.  0   No current facility-administered medications for this visit.     PHYSICAL EXAMINATION: ECOG PERFORMANCE STATUS: 1 - Symptomatic but completely ambulatory  Vitals:   01/28/18 1013  BP: 116/74  Pulse: 89  Resp: 18  Temp: 97.6 F (36.4 C)  SpO2: 94%   Filed Weights   01/28/18 1013  Weight: 167 lb 3.2 oz (75.8 kg)    GENERAL:alert, no distress and comfortable SKIN: skin color, texture, turgor are normal, no rashes or significant lesions EYES: normal, Conjunctiva are pink and non-injected, sclera clear OROPHARYNX:no exudate, no erythema and lips, buccal mucosa, and tongue normal  NECK: supple, thyroid normal size, non-tender, without nodularity LYMPH:  no palpable  lymphadenopathy in the cervical, axillary or inguinal LUNGS: clear to auscultation and percussion with normal breathing effort HEART: regular rate & rhythm and no murmurs and no lower extremity edema ABDOMEN:abdomen soft, non-tender and normal bowel sounds MUSCULOSKELETAL:no cyanosis of digits and no clubbing  NEURO: alert & oriented x 3 with fluent speech, no focal motor/sensory deficits EXTREMITIES: No lower extremity edema BREAST: No palpable masses or nodules in either right or left breasts. No palpable axillary supraclavicular or infraclavicular adenopathy no breast tenderness or nipple discharge. (exam performed in the presence of a chaperone)  LABORATORY DATA:  I have reviewed the data as listed CMP Latest Ref Rng & Units 11/16/2014 09/21/2014 08/31/2014  Glucose 70 - 140 mg/dl 110 97 146(H)  BUN 7.0 - 26.0 mg/dL 13.9 13.9 14.6  Creatinine 0.6 - 1.1 mg/dL 0.8 0.7 0.7  Sodium 136 - 145 mEq/L 141 141 142  Potassium 3.5 - 5.1 mEq/L 4.3 3.7 3.3(L)  CO2 22 - 29 mEq/L 21(L) 21(L) 23  Calcium 8.4 - 10.4 mg/dL 9.4 9.2 9.5  Total Protein 6.4 - 8.3 g/dL 6.9 6.3(L) 7.1  Total Bilirubin 0.20 - 1.20 mg/dL  0.37 0.45 0.61  Alkaline Phos 40 - 150 U/L 52 50 57  AST 5 - 34 U/L '13 11 12  ' ALT 0 - 55 U/L '17 18 20    ' Lab Results  Component Value Date   WBC 4.0 11/16/2014   HGB 14.0 11/16/2014   HCT 42.4 11/16/2014   MCV 94.2 11/16/2014   PLT 266 11/16/2014   NEUTROABS 3.1 11/16/2014    ASSESSMENT & PLAN:  Breast cancer of lower-outer quadrant of right female breast Right breast invasive ductal carcinoma, grade 2, 0.9 cm, 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  She uses it occasionally  Breast Cancer Surveillance: 1. Breast exam11/19/2019: Normal 2. Mammogram5/9/2019no abnormalities. Postsurgical changes. Breast Density Category C.  Patient is not doing exercise but is going to yoga once a week  Return to clinic in1 year for follow-up    No orders of the defined types were placed in this encounter.  The patient has a good understanding of the overall plan. she agrees with it. she will call with any problems that may develop before the next visit here.  Nicholas Lose, MD 01/28/2018   I, Cloyde Reams Dorshimer, am acting as scribe for Nicholas Lose, MD.  I have reviewed the above documentation for accuracy and completeness, and I agree with the above.

## 2018-01-31 ENCOUNTER — Other Ambulatory Visit: Payer: Self-pay

## 2018-03-25 MED FILL — ANASTROZOLE 1 MG TABLET: 1 | 90 days supply | Qty: 90 | Fill #0

## 2018-07-01 MED FILL — ANASTROZOLE 1 MG TABLET: 1 | 90 days supply | Qty: 90 | Fill #1

## 2018-07-08 ENCOUNTER — Other Ambulatory Visit: Payer: Self-pay | Admitting: Hematology and Oncology

## 2018-07-08 DIAGNOSIS — Z853 Personal history of malignant neoplasm of breast: Secondary | ICD-10-CM

## 2018-08-18 ENCOUNTER — Other Ambulatory Visit: Payer: Self-pay

## 2018-08-18 ENCOUNTER — Ambulatory Visit
Admission: RE | Admit: 2018-08-18 | Discharge: 2018-08-18 | Disposition: A | Discharge status: Home / Self Care | Payer: Medicare Other | Source: Ambulatory Visit | Attending: Hematology and Oncology | Admitting: Hematology and Oncology

## 2018-08-18 DIAGNOSIS — Z853 Personal history of malignant neoplasm of breast: Secondary | ICD-10-CM

## 2018-09-22 MED FILL — ANASTROZOLE 1 MG TABLET: 1 | 90 days supply | Qty: 90 | Fill #2

## 2018-12-23 MED FILL — ANASTROZOLE 1 MG TABLET: 1 | 90 days supply | Qty: 90 | Fill #3

## 2019-01-29 NOTE — Progress Notes (Signed)
Patient Care Team: Alroy Dust, L.Marlou Sa, MD as PCP - General (Family Medicine) Jovita Kussmaul, MD as Consulting Physician (General Surgery) Nicholas Lose, MD as Consulting Physician (Hematology and Oncology) Arloa Koh, MD (Inactive) as Consulting Physician (Radiation Oncology) Rockwell Germany, RN as Registered Nurse Mauro Kaufmann, RN as Registered Nurse Holley Bouche, NP (Inactive) as Nurse Practitioner (Nurse Practitioner) Sylvan Cheese, NP as Nurse Practitioner (Hematology and Oncology)  DIAGNOSIS:    ICD-10-CM   1. Malignant neoplasm of lower-outer quadrant of right breast of female, estrogen receptor positive ([place])  C50.511    Z17.0     SUMMARY OF ONCOLOGIC HISTORY: Oncology History  Breast cancer of lower-outer quadrant of right female breast ([place])  04/05/2014 Mammogram   Right breast: possible new masses warranting further imaging   04/19/2014 Breast US   Right breast: irregular hypoechoic mass in the 6 o'clock location, 4 cm from the nipple and posterior in location, measuring 0.9 x 0.6 x 0.6 cm. No other findings. No LAD.   04/30/2014 Initial Biopsy   Right breast biopsy 6:00: Invasive mammary cancer favor ductal, grade 2, ER+ (96%), PR+ (45%), HER-2 negative (ratio 1.03), Ki-67 49%.   05/11/2014 Breast MRI   Right breast 6:00 position: 0.9 x 0.7 x 0.6 cm mass, right breast 1 cm T2 hyperintense enhancing mass suggestive of benign fibroadenoma which has been stable   05/12/2014 Clinical Stage   Stage IA: T1b N0   06/24/2014 Definitive Surgery   Right lumpectomy/SLNB Marlou Starks): Invasive ductal carcinoma, grade 2, 0.9 cm, 3 LN removed and negative (0/3). DCIS, intermediate grade.    06/24/2014 Pathologic Stage   Stage IA: pT1b pN0   06/24/2014 Oncotype testing   Oncotype DX recurrence score 28, (18% ROR)    07/20/2014 - 09/21/2014 Chemotherapy   Adjuvant docetaxel and cyclophosphamide q 3 weeks x 4 cycles   11/02/2014 - 12/07/2014 Radiation Therapy   Adjuvant  RT Valere Dross): Right breast 50 Gy over 25 fractions, no boost   12/25/2014 -  Anti-estrogen oral therapy   Anastrozole 1 mg daily.  Planned duration of therapy 5 years.   01/11/2015 Survivorship   Survivorship care plan completed and mailed to patient in lieu of in person visit.     CHIEF COMPLIANT: Follow-up of breast cancer on anastrozole  INTERVAL HISTORY: Tammy Day is a 68 y.o. Tammy above-mentioned history of right breast cancer treated Tammy lumpectomy, adjuvant chemotherapy, radiation, and who is currently on anti-estrogen therapy Tammy anastrozole. I last saw her a year ago. Mammogram on 08/18/18 showed no evidence of malignancy bilaterally. She presents to the clinic today for annual follow-up.   REVIEW OF SYSTEMS:   Constitutional: Denies fevers, chills or abnormal weight loss Eyes: Denies blurriness of vision Ears, nose, mouth, throat, and face: Denies mucositis or sore throat Respiratory: Denies cough, dyspnea or wheezes Cardiovascular: Denies palpitation, chest discomfort Gastrointestinal: Denies nausea, heartburn or change in bowel habits Skin: Denies abnormal skin rashes Lymphatics: Denies new lymphadenopathy or easy bruising Neurological: Denies numbness, tingling or new weaknesses Behavioral/Psych: Mood is stable, no new changes  Extremities: No lower extremity edema Breast: denies any pain or lumps or nodules in either breasts All other systems were reviewed Tammy the patient and are negative.  I have reviewed the past medical history, past surgical history, social history and family history Tammy the patient and they are unchanged from previous Day.  ALLERGIES:  is allergic to atorvastatin; nexium [esomeprazole magnesium]; other; pravastatin; prilosec [omeprazole]; and rabeprazole.  MEDICATIONS:  Current Outpatient Medications  Medication Sig Dispense Refill  . anastrozole (ARIMIDEX) 1 MG tablet Take 1 tablet (1 mg total) by mouth daily. 90 tablet 3  .  Ascorbic Acid 125 MG CHEW Chew by mouth.    Marland Kitchen aspirin 81 MG tablet Take 81 mg by mouth daily.    Marland Kitchen azelastine (ASTELIN) 0.1 % nasal spray Place into both nostrils 2 (two) times daily as needed. Use in each nostril as directed     . Calcium-Phosphorus-Vitamin D (CALCIUM/D3 ADULT GUMMIES PO) Take by mouth.    . Multiple Vitamins-Minerals (ONE-A-DAY VITACRAVES PO) Take by mouth daily.    . Omega-3 Fatty Acids (FISH OIL) 1200 MG CAPS Take by mouth daily.    . RABEprazole (ACIPHEX) 20 MG tablet Take 20 mg by mouth as needed.     . rosuvastatin (CRESTOR) 10 MG tablet Take 10 mg by mouth 3 (three) times a week. Monday, Wednesday,Friday    . Vaginal Lubricant (REPLENS) GEL Place 1 application vaginally daily.  0   No current facility-administered medications for this visit.     PHYSICAL EXAMINATION: ECOG PERFORMANCE STATUS: 1 - Symptomatic but completely ambulatory  Vitals:   01/30/19 1114  BP: 130/76  Pulse: 70  Resp: 16  Temp: 98 F (36.7 C)  SpO2: 98%   Filed Weights   01/30/19 1114  Weight: 157 lb 12.8 oz (71.6 kg)    GENERAL: alert, no distress and comfortable SKIN: skin color, texture, turgor are normal, no rashes or significant lesions EYES: normal, Conjunctiva are pink and non-injected, sclera clear OROPHARYNX: no exudate, no erythema and lips, buccal mucosa, and tongue normal  NECK: supple, thyroid normal size, non-tender, without nodularity LYMPH: no palpable lymphadenopathy in the cervical, axillary or inguinal LUNGS: clear to auscultation and percussion Tammy normal breathing effort HEART: regular rate & rhythm and no murmurs and no lower extremity edema ABDOMEN: abdomen soft, non-tender and normal bowel sounds MUSCULOSKELETAL: no cyanosis of digits and no clubbing  NEURO: alert & oriented x 3 Tammy fluent speech, no focal motor/sensory deficits EXTREMITIES: No lower extremity edema BREAST: No palpable masses or nodules in either right or left breasts. No palpable axillary  supraclavicular or infraclavicular adenopathy no breast tenderness or nipple discharge. (exam performed in the presence of a chaperone)  LABORATORY DATA:  I have reviewed the data as listed CMP Latest Ref Rng & Units 11/16/2014 09/21/2014 08/31/2014  Glucose 70 - 140 mg/dl 110 97 146(H)  BUN 7.0 - 26.0 mg/dL 13.9 13.9 14.6  Creatinine 0.6 - 1.1 mg/dL 0.8 0.7 0.7  Sodium 136 - 145 mEq/L 141 141 142  Potassium 3.5 - 5.1 mEq/L 4.3 3.7 3.3(L)  CO2 22 - 29 mEq/L 21(L) 21(L) 23  Calcium 8.4 - 10.4 mg/dL 9.4 9.2 9.5  Total Protein 6.4 - 8.3 g/dL 6.9 6.3(L) 7.1  Total Bilirubin 0.20 - 1.20 mg/dL 0.37 0.45 0.61  Alkaline Phos 40 - 150 U/L 52 50 57  AST 5 - 34 U/L '13 11 12  ' ALT 0 - 55 U/L '17 18 20    ' Lab Results  Component Value Date   WBC 4.0 11/16/2014   HGB 14.0 11/16/2014   HCT 42.4 11/16/2014   MCV 94.2 11/16/2014   PLT 266 11/16/2014   NEUTROABS 3.1 11/16/2014    ASSESSMENT & PLAN:  Breast cancer of lower-outer quadrant of right female breast Right breast invasive ductal carcinoma, grade 2, 0.9 cm, 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 long pharmacy  Profound vaginal dryness: Replensis working very well for her.  She uses it occasionally  Breast Cancer Surveillance: 1. Breast exam11/20/2020: Normal 2. Mammogram6/8/2020no abnormalities. Postsurgical changes. Breast Density Category C.  Patient is not doing exercise but is going to yoga once a week  Return to clinic in1 year for follow-up   No  orders of the defined types were placed in this encounter.  The patient has a good understanding of the overall plan. she agrees Tammy it. she will call Tammy any problems that may develop before the next visit here.  Nicholas Lose, MD 01/30/2019  Julious Oka Dorshimer, am acting as scribe for Dr. Nicholas Lose.  I have reviewed the above documentation for accuracy and completeness, and I agree Tammy the above.

## 2019-01-30 ENCOUNTER — Other Ambulatory Visit: Payer: Self-pay

## 2019-01-30 ENCOUNTER — Inpatient Hospital Stay: Payer: Medicare Other | Attending: Hematology and Oncology | Admitting: Hematology and Oncology

## 2019-01-30 DIAGNOSIS — Z79811 Long term (current) use of aromatase inhibitors: Secondary | ICD-10-CM | POA: Insufficient documentation

## 2019-01-30 DIAGNOSIS — R232 Flushing: Secondary | ICD-10-CM | POA: Diagnosis not present

## 2019-01-30 DIAGNOSIS — Z170 Estrogen receptor positive status [ER+]: Secondary | ICD-10-CM | POA: Insufficient documentation

## 2019-01-30 DIAGNOSIS — C50511 Malignant neoplasm of lower-outer quadrant of right female breast: Secondary | ICD-10-CM | POA: Insufficient documentation

## 2019-01-30 DIAGNOSIS — M791 Myalgia, unspecified site: Secondary | ICD-10-CM | POA: Diagnosis not present

## 2019-01-30 DIAGNOSIS — Z171 Estrogen receptor negative status [ER-]: Secondary | ICD-10-CM

## 2019-01-30 DIAGNOSIS — Z79899 Other long term (current) drug therapy: Secondary | ICD-10-CM | POA: Diagnosis not present

## 2019-01-30 DIAGNOSIS — Z9221 Personal history of antineoplastic chemotherapy: Secondary | ICD-10-CM | POA: Insufficient documentation

## 2019-01-30 MED ORDER — ELDERBERRY 575 MG/5ML PO SYRP: 5.0000 mL | ORAL_SOLUTION | Freq: Every day | ORAL | Status: AC

## 2019-01-30 MED ORDER — EZETIMIBE 10 MG PO TABS: 10.0000 mg | ORAL_TABLET | Freq: Every day | ORAL | Status: AC

## 2019-01-30 MED ORDER — ANASTROZOLE 1 MG PO TABS: 1.0000 mg | ORAL_TABLET | Freq: Every day | ORAL | 3 refills | Status: DC

## 2019-01-30 NOTE — Assessment & Plan Note (Signed)
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  She uses it occasionally  Breast Cancer Surveillance: 1. Breast exam11/20/2020: Normal 2. Mammogram6/8/2020no abnormalities. Postsurgical changes. Breast Density Category C.  Patient is not doing exercise but is going to yoga once a week  Return to clinic in1 year for follow-up

## 2019-02-02 ENCOUNTER — Telehealth: Payer: Self-pay | Admitting: Hematology and Oncology

## 2019-02-02 NOTE — Telephone Encounter (Signed)
I talk with patient regarding schedule  

## 2019-03-25 MED FILL — ANASTROZOLE 1 MG TABLET: 1 | 90 days supply | Qty: 90 | Fill #0

## 2019-04-12 ENCOUNTER — Ambulatory Visit: Payer: Medicare Other

## 2019-04-17 ENCOUNTER — Ambulatory Visit: Payer: Medicare Other

## 2019-04-19 ENCOUNTER — Ambulatory Visit: Payer: Medicare Other

## 2019-04-20 ENCOUNTER — Ambulatory Visit: Payer: Medicare PPO | Attending: Internal Medicine

## 2019-04-20 DIAGNOSIS — Z23 Encounter for immunization: Secondary | ICD-10-CM

## 2019-04-20 NOTE — Progress Notes (Signed)
   Covid-19 Vaccination Clinic  Name:  Tammy Day    MRN: [id number] DOB: 08/19/1950  04/20/2019  [name] was observed post Covid-19 immunization for 15 minutes without incidence. She was provided with Vaccine Information Sheet and instruction to access the V-Safe system.   [name] was instructed to call 911 with any severe reactions post vaccine: Marland Kitchen Difficulty breathing  . Swelling of your face and throat  . A fast heartbeat  . A bad rash all over your body  . Dizziness and weakness    Immunizations Administered    Name Date Dose VIS Date Route   Pfizer COVID-19 Vaccine 04/20/2019 12:56 PM 0.3 mL 02/20/2019 Intramuscular   Manufacturer: [place]   Lot: [id number]   [place]: [id number]

## 2019-05-15 ENCOUNTER — Ambulatory Visit: Payer: Medicare PPO | Attending: Internal Medicine

## 2019-05-15 DIAGNOSIS — Z23 Encounter for immunization: Secondary | ICD-10-CM | POA: Insufficient documentation

## 2019-05-15 NOTE — Progress Notes (Signed)
   Covid-19 Vaccination Clinic  Name:  Marc Leidig    MRN: [id number] DOB: [birth year]  05/15/2019  Ms. Hatala was observed post Covid-19 immunization for 15 minutes without incident. She was provided with Vaccine Information Sheet and instruction to access the V-Safe system.   Ms. Lacy was instructed to call 911 with any severe reactions post vaccine: Marland Kitchen Difficulty breathing  . Swelling of face and throat  . A fast heartbeat  . A bad rash all over body  . Dizziness and weakness   Immunizations Administered    Name Date Dose VIS Date Route   Pfizer COVID-19 Vaccine 05/15/2019  8:15 AM 0.3 mL 02/20/2019 Intramuscular   Manufacturer: [place]   Lot: [id number]   [place]: [id number]

## 2019-06-17 MED FILL — ANASTROZOLE 1 MG TABLET: 1 | 90 days supply | Qty: 90 | Fill #1

## 2019-07-09 ENCOUNTER — Other Ambulatory Visit: Payer: Self-pay | Admitting: Hematology and Oncology

## 2019-07-09 DIAGNOSIS — Z9889 Other specified postprocedural states: Secondary | ICD-10-CM

## 2019-08-19 ENCOUNTER — Ambulatory Visit
Admission: RE | Admit: 2019-08-19 | Discharge: 2019-08-19 | Disposition: A | Discharge status: Home / Self Care | Payer: Medicare PPO | Source: Ambulatory Visit | Attending: Hematology and Oncology | Admitting: Hematology and Oncology

## 2019-08-19 ENCOUNTER — Other Ambulatory Visit: Payer: Self-pay

## 2019-08-19 DIAGNOSIS — Z9889 Other specified postprocedural states: Secondary | ICD-10-CM

## 2019-09-14 MED FILL — ANASTROZOLE 1 MG TABLET: 1 | 90 days supply | Qty: 90 | Fill #2

## 2019-10-29 ENCOUNTER — Encounter: Payer: Self-pay | Admitting: Hematology and Oncology

## 2019-11-03 DIAGNOSIS — K5730 Diverticulosis of large intestine without perforation or abscess without bleeding: Secondary | ICD-10-CM | POA: Diagnosis not present

## 2019-11-03 DIAGNOSIS — K621 Rectal polyp: Secondary | ICD-10-CM | POA: Diagnosis not present

## 2019-11-03 DIAGNOSIS — K635 Polyp of colon: Secondary | ICD-10-CM | POA: Diagnosis not present

## 2019-11-03 DIAGNOSIS — D123 Benign neoplasm of transverse colon: Secondary | ICD-10-CM | POA: Diagnosis not present

## 2019-11-03 DIAGNOSIS — Z8371 Family history of colonic polyps: Secondary | ICD-10-CM | POA: Diagnosis not present

## 2019-11-06 DIAGNOSIS — K635 Polyp of colon: Secondary | ICD-10-CM | POA: Diagnosis not present

## 2019-11-06 DIAGNOSIS — K621 Rectal polyp: Secondary | ICD-10-CM | POA: Diagnosis not present

## 2019-11-06 DIAGNOSIS — D123 Benign neoplasm of transverse colon: Secondary | ICD-10-CM | POA: Diagnosis not present

## 2019-12-19 DIAGNOSIS — Z23 Encounter for immunization: Secondary | ICD-10-CM | POA: Diagnosis not present

## 2019-12-21 MED FILL — ANASTROZOLE 1 MG TABLET: 1 | 90 days supply | Qty: 90 | Fill #3

## 2020-01-12 DIAGNOSIS — J309 Allergic rhinitis, unspecified: Secondary | ICD-10-CM | POA: Diagnosis not present

## 2020-01-12 DIAGNOSIS — E7800 Pure hypercholesterolemia, unspecified: Secondary | ICD-10-CM | POA: Diagnosis not present

## 2020-01-12 DIAGNOSIS — Z0000 Encounter for general adult medical examination without abnormal findings: Secondary | ICD-10-CM | POA: Diagnosis not present

## 2020-02-01 ENCOUNTER — Ambulatory Visit: Payer: Medicare Other | Admitting: Hematology and Oncology

## 2020-02-07 NOTE — Progress Notes (Signed)
 Patient Care Team: Mitchell, L.Dean, MD as PCP - General (Family Medicine) Toth, Paul III, MD as Consulting Physician (General Surgery) Gudena, Vinay, MD as Consulting Physician (Hematology and Oncology) Murray, Robert, MD (Inactive) as Consulting Physician (Radiation Oncology) Martini, Keisha N, RN as Registered Nurse Stuart, Dawn C, RN as Registered Nurse Dawson, Gretchen W, NP (Inactive) as Nurse Practitioner (Nurse Practitioner) Mackey, Heather Thompson, NP as Nurse Practitioner (Hematology and Oncology)  DIAGNOSIS:    ICD-10-CM   1. Malignant neoplasm of lower-outer quadrant of right breast of female, estrogen receptor positive ([facility])  C50.511    Z17.0     SUMMARY OF ONCOLOGIC HISTORY: Oncology History  Breast cancer of lower-outer quadrant of right female breast ([facility])  04/05/2014 Mammogram   Right breast: possible new masses warranting further imaging   04/19/2014 Breast US   Right breast: irregular hypoechoic mass in the 6 o'clock location, 4 cm from the nipple and posterior in location, measuring 0.9 x 0.6 x 0.6 cm. No other findings. No LAD.   04/30/2014 Initial Biopsy   Right breast biopsy 6:00: Invasive mammary cancer favor ductal, grade 2, ER+ (96%), PR+ (45%), HER-2 negative (ratio 1.03), Ki-67 49%.   05/11/2014 Breast MRI   Right breast 6:00 position: 0.9 x 0.7 x 0.6 cm mass, right breast 1 cm T2 hyperintense enhancing mass suggestive of benign fibroadenoma which has been stable   05/12/2014 Clinical Stage   Stage IA: T1b N0   06/24/2014 Definitive Surgery   Right lumpectomy/SLNB (Toth): Invasive ductal carcinoma, grade 2, 0.9 cm, 3 LN removed and negative (0/3). DCIS, intermediate grade.    06/24/2014 Pathologic Stage   Stage IA: pT1b pN0   06/24/2014 Oncotype testing   Oncotype DX recurrence score 28, (18% ROR)    07/20/2014 - 09/21/2014 Chemotherapy   Adjuvant docetaxel and cyclophosphamide q 3 weeks x 4 cycles   11/02/2014 - 12/07/2014 Radiation Therapy   Adjuvant  RT (Murray): Right breast 50 Gy over 25 fractions, no boost   12/25/2014 -  Anti-estrogen oral therapy   Anastrozole 1 mg daily.  Planned duration of therapy 5 years.   01/11/2015 Survivorship   Survivorship care plan completed and mailed to patient in lieu of in person visit.     CHIEF COMPLIANT: Follow-up of breast cancer on anastrozole  INTERVAL HISTORY: Tammy Day is a 69 y.o. with above-mentioned history of right breast cancer treated with lumpectomy, adjuvant chemotherapy, radiation, and who is currently on anti-estrogen therapy with anastrozole. Mammogram on 08/19/19 showed no evidence of malignancy bilaterally. She presents to the clinic today for annual follow-up.   She is tolerating anastrozole extremely well without any problems or concerns.  Denies any lumps or nodules in the breast.  ALLERGIES:  is allergic to atorvastatin, nexium [esomeprazole magnesium], other, pravastatin, prilosec [omeprazole], and rabeprazole.  MEDICATIONS:  Current Outpatient Medications  Medication Sig Dispense Refill  . anastrozole (ARIMIDEX) 1 MG tablet Take 1 tablet (1 mg total) by mouth daily. 90 tablet 3  . Ascorbic Acid 125 MG CHEW Chew by mouth.    . aspirin 81 MG tablet Take 81 mg by mouth daily.    . azelastine (ASTELIN) 0.1 % nasal spray Place into both nostrils 2 (two) times daily as needed. Use in each nostril as directed     . Calcium-Phosphorus-Vitamin D (CALCIUM/D3 ADULT GUMMIES PO) Take by mouth.    . Elderberry 575 MG/5ML SYRP Take 5 mLs by mouth daily.    . ezetimibe (ZETIA) 10 MG tablet Take 1   tablet (10 mg total) by mouth daily.    . Multiple Vitamins-Minerals (ONE-A-DAY VITACRAVES PO) Take by mouth daily.    . Omega-3 Fatty Acids (FISH OIL) 1200 MG CAPS Take by mouth daily.    . RABEprazole (ACIPHEX) 20 MG tablet Take 20 mg by mouth as needed.     . rosuvastatin (CRESTOR) 10 MG tablet Take 10 mg by mouth 3 (three) times a week. Monday, Wednesday,Friday    . Vaginal  Lubricant (REPLENS) GEL Place 1 application vaginally daily.  0   No current facility-administered medications for this visit.    PHYSICAL EXAMINATION: ECOG PERFORMANCE STATUS: 1 - Symptomatic but completely ambulatory  Vitals:   02/08/20 1006  BP: 128/75  Pulse: 74  Resp: 18  Temp: 97.7 F (36.5 C)  SpO2: 99%   Filed Weights   02/08/20 1006  Weight: 154 lb 11.2 oz (70.2 kg)    BREAST: No palpable masses or nodules in either right or left breasts. No palpable axillary supraclavicular or infraclavicular adenopathy no breast tenderness or nipple discharge. (exam performed in the presence of a chaperone)  LABORATORY DATA:  I have reviewed the data as listed CMP Latest Ref Rng & Units 11/16/2014 09/21/2014 08/31/2014  Glucose 70 - 140 mg/dl 110 97 146(H)  BUN 7.0 - 26.0 mg/dL 13.9 13.9 14.6  Creatinine 0.6 - 1.1 mg/dL 0.8 0.7 0.7  Sodium 136 - 145 mEq/L 141 141 142  Potassium 3.5 - 5.1 mEq/L 4.3 3.7 3.3(L)  CO2 22 - 29 mEq/L 21(L) 21(L) 23  Calcium 8.4 - 10.4 mg/dL 9.4 9.2 9.5  Total Protein 6.4 - 8.3 g/dL 6.9 6.3(L) 7.1  Total Bilirubin 0.20 - 1.20 mg/dL 0.37 0.45 0.61  Alkaline Phos 40 - 150 U/L 52 50 57  AST 5 - 34 U/L _0 ALT 0 - 55 U/L _1 Lab Results  Component Value Date   WBC 4.0 11/16/2014   HGB 14.0 11/16/2014   HCT 42.4 11/16/2014   MCV 94.2 11/16/2014   PLT 266 11/16/2014   NEUTROABS 3.1 11/16/2014    ASSESSMENT & PLAN:  Breast cancer of lower-outer quadrant of right female breast Right breast invasive ductal carcinoma, grade 2, 0.9 cm, 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status post chemotherapy with Taxotere Cytoxan every 3 weeks 4 cycles started 07/20/2014 completed 09/21/2014 S/P adjuvant radiation completed 12/07/2014, started anastrozole 1 mg daily 12/25/2014  Anastrozole toxicities: Monitoring  closely for anastrozole toxicities Hot flashes and myalgias resolved since she changed prescription to [place] is to continue the treatment for 7 years total  Profound vaginal dryness: Replensis working very well for her.She uses it occasionally  Breast Cancer Surveillance: 1. Breast exam11/29/2021: Benign 2. Mammogram6/9/21noabnormalities. Postsurgical changes. Breast Density Category C.    Return to clinic in1 year for follow-up    No orders of the defined types were placed in this encounter.  The patient has a good understanding of the overall plan. she agrees with it. she will call with any problems that may develop before the next visit here.  Total time spent: 20 mins including face to face time and time spent for planning, charting and coordination of care  Nicholas Lose, MD 02/08/2020  I, Cloyde Reams Dorshimer, am acting as scribe for Dr. Nicholas Lose.  I have reviewed the above documentation for accuracy and completeness,  and I agree with the above.

## 2020-02-07 NOTE — Assessment & Plan Note (Signed)
Right breast invasive ductal carcinoma, grade 2, 0.9 cm, 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status post chemotherapy with Taxotere Cytoxan every 3 weeks 4 cycles started 07/20/2014 completed 09/21/2014 S/P adjuvant radiation completed 12/07/2014, started anastrozole 1 mg daily 12/25/2014  Anastrozole toxicities: Monitoring closely for anastrozole toxicities Hot flashes and myalgias resolved since she changed prescription to [facility] long pharmacy  Profound vaginal dryness: Replensis working very well for her.She uses it occasionally  Breast Cancer Surveillance: 1. Breast exam11/29/2021: Benign 2. Mammogram6/9/21noabnormalities. Postsurgical changes. Breast Density Category C.  Patient is not doing exercise but is going to yoga once a week  Return to clinic in1 year for follow-up

## 2020-02-08 ENCOUNTER — Other Ambulatory Visit: Payer: Self-pay

## 2020-02-08 ENCOUNTER — Inpatient Hospital Stay: Payer: Medicare PPO | Attending: Hematology and Oncology | Admitting: Hematology and Oncology

## 2020-02-08 ENCOUNTER — Telehealth: Payer: Self-pay | Admitting: Hematology and Oncology

## 2020-02-08 ENCOUNTER — Other Ambulatory Visit: Payer: Self-pay | Admitting: Hematology and Oncology

## 2020-02-08 DIAGNOSIS — R232 Flushing: Secondary | ICD-10-CM | POA: Insufficient documentation

## 2020-02-08 DIAGNOSIS — Z923 Personal history of irradiation: Secondary | ICD-10-CM | POA: Diagnosis not present

## 2020-02-08 DIAGNOSIS — Z171 Estrogen receptor negative status [ER-]: Secondary | ICD-10-CM

## 2020-02-08 DIAGNOSIS — Z79811 Long term (current) use of aromatase inhibitors: Secondary | ICD-10-CM | POA: Insufficient documentation

## 2020-02-08 DIAGNOSIS — Z170 Estrogen receptor positive status [ER+]: Secondary | ICD-10-CM | POA: Diagnosis not present

## 2020-02-08 DIAGNOSIS — Z9221 Personal history of antineoplastic chemotherapy: Secondary | ICD-10-CM | POA: Insufficient documentation

## 2020-02-08 DIAGNOSIS — Z79899 Other long term (current) drug therapy: Secondary | ICD-10-CM | POA: Diagnosis not present

## 2020-02-08 DIAGNOSIS — C50511 Malignant neoplasm of lower-outer quadrant of right female breast: Secondary | ICD-10-CM | POA: Diagnosis not present

## 2020-02-08 DIAGNOSIS — M791 Myalgia, unspecified site: Secondary | ICD-10-CM | POA: Insufficient documentation

## 2020-02-08 MED ORDER — B COMPLEX 50 PO TABS: 1.0000 | ORAL_TABLET | Freq: Every day | ORAL | 0 refills | Status: AC

## 2020-02-08 MED ORDER — CRANBERRY 180 MG PO CAPS: 1.0000 | ORAL_CAPSULE | Freq: Every day | ORAL | Status: AC

## 2020-02-08 MED ORDER — ANASTROZOLE 1 MG PO TABS: 1.0000 mg | ORAL_TABLET | Freq: Every day | ORAL | 3 refills | Status: DC

## 2020-02-08 MED ORDER — VITAMIN D 50 MCG (2000 UT) PO CAPS: 1.0000 | ORAL_CAPSULE | Freq: Every day | ORAL | Status: AC

## 2020-02-08 NOTE — Telephone Encounter (Signed)
Scheduled appts per 11/29 los. Gave pt a print out of AVS.  

## 2020-03-19 MED FILL — ANASTROZOLE 1 MG TABLET: 1 | 90 days supply | Qty: 90 | Fill #0

## 2020-03-21 ENCOUNTER — Other Ambulatory Visit: Payer: Self-pay | Admitting: *Deleted

## 2020-03-21 ENCOUNTER — Other Ambulatory Visit: Payer: Self-pay | Admitting: Hematology and Oncology

## 2020-03-21 DIAGNOSIS — C50511 Malignant neoplasm of lower-outer quadrant of right female breast: Secondary | ICD-10-CM

## 2020-03-21 DIAGNOSIS — Z171 Estrogen receptor negative status [ER-]: Secondary | ICD-10-CM

## 2020-03-21 MED ORDER — ANASTROZOLE 1 MG PO TABS: 1.0000 mg | ORAL_TABLET | Freq: Every day | ORAL | 3 refills | Status: DC

## 2020-06-02 ENCOUNTER — Other Ambulatory Visit (HOSPITAL_BASED_OUTPATIENT_CLINIC_OR_DEPARTMENT_OTHER): Payer: Self-pay

## 2020-06-16 ENCOUNTER — Other Ambulatory Visit (HOSPITAL_COMMUNITY): Payer: Self-pay

## 2020-06-16 MED FILL — Anastrozole Tab 1 MG: ORAL | 90 days supply | Qty: 90 | Fill #0 | Status: AC

## 2020-06-22 ENCOUNTER — Other Ambulatory Visit: Payer: Self-pay

## 2020-06-22 ENCOUNTER — Encounter: Payer: Self-pay | Admitting: Physician Assistant

## 2020-06-22 ENCOUNTER — Ambulatory Visit: Payer: Medicare PPO | Admitting: Physician Assistant

## 2020-06-22 DIAGNOSIS — D1800 Hemangioma unspecified site: Secondary | ICD-10-CM | POA: Diagnosis not present

## 2020-06-22 DIAGNOSIS — Z1283 Encounter for screening for malignant neoplasm of skin: Secondary | ICD-10-CM | POA: Diagnosis not present

## 2020-06-22 DIAGNOSIS — D239 Other benign neoplasm of skin, unspecified: Secondary | ICD-10-CM

## 2020-06-22 DIAGNOSIS — L821 Other seborrheic keratosis: Secondary | ICD-10-CM | POA: Diagnosis not present

## 2020-06-22 DIAGNOSIS — D2372 Other benign neoplasm of skin of left lower limb, including hip: Secondary | ICD-10-CM | POA: Diagnosis not present

## 2020-06-22 DIAGNOSIS — L578 Other skin changes due to chronic exposure to nonionizing radiation: Secondary | ICD-10-CM

## 2020-06-22 DIAGNOSIS — L814 Other melanin hyperpigmentation: Secondary | ICD-10-CM

## 2020-06-22 MED ORDER — BETAMETHASONE DIPROPIONATE 0.05 % EX CREA: TOPICAL_CREAM | CUTANEOUS | 1 refills | Status: DC

## 2020-06-22 NOTE — Progress Notes (Signed)
   Follow-Up Visit   Subjective  Tammy Day is a 70 y.o. female who presents for the following: Annual Exam (Left inner shin x months raised pink scale (DF) new lesion, patient has history of 2 atypical moles listed in history.).   The following portions of the chart were reviewed this encounter and updated as appropriate:  Allergies  Meds  Problems      Objective  Well appearing patient in no apparent distress; mood and affect are within normal limits.  A full examination was performed including scalp, head, eyes, ears, nose, lips, neck, chest, axillae, abdomen, back, buttocks, bilateral upper extremities, bilateral lower extremities, hands, feet, fingers, toes, fingernails, and toenails. All findings within normal limits unless otherwise noted below.  Objective  Left Lower Leg - Anterior: Dense dark plaque   Assessment & Plan  Dermatofibroma Left Lower Leg - Anterior  betamethasone dipropionate 0.05 % cream - Left Lower Leg - Anterior   Lentigines - Scattered tan macules - Discussed due to sun exposure - Benign, observe - Call for any changes  Seborrheic Keratoses - Stuck-on, waxy, tan-brown papules and plaques  - Discussed benign etiology and prognosis. - Observe - Call for any changes   Hemangiomas - Red papules - Discussed benign nature - Observe - Call for any changes  Actinic Damage - diffuse scaly erythematous macules with underlying dyspigmentation - Recommend daily broad spectrum sunscreen SPF 30+ to sun-exposed areas, reapply every 2 hours as needed.  - Call for new or changing lesions.  Skin cancer screening performed today.   I, Tinika Bucknam, PA-C, have reviewed all documentation's for this visit.  The documentation on 06/22/20 for the exam, diagnosis, procedures and orders are all accurate and complete.

## 2020-07-27 ENCOUNTER — Other Ambulatory Visit: Payer: Self-pay | Admitting: Hematology and Oncology

## 2020-07-27 DIAGNOSIS — Z1231 Encounter for screening mammogram for malignant neoplasm of breast: Secondary | ICD-10-CM

## 2020-09-09 ENCOUNTER — Other Ambulatory Visit (HOSPITAL_COMMUNITY): Payer: Self-pay

## 2020-09-09 MED FILL — Anastrozole Tab 1 MG: ORAL | 90 days supply | Qty: 90 | Fill #1 | Status: AC

## 2020-09-22 ENCOUNTER — Other Ambulatory Visit: Payer: Self-pay

## 2020-09-22 ENCOUNTER — Ambulatory Visit
Admission: RE | Admit: 2020-09-22 | Discharge: 2020-09-22 | Disposition: A | Discharge status: Home / Self Care | Payer: Medicare PPO | Source: Ambulatory Visit | Attending: Hematology and Oncology | Admitting: Hematology and Oncology

## 2020-09-22 DIAGNOSIS — Z1231 Encounter for screening mammogram for malignant neoplasm of breast: Secondary | ICD-10-CM

## 2020-09-28 ENCOUNTER — Other Ambulatory Visit: Payer: Self-pay | Admitting: Family Medicine

## 2020-09-28 DIAGNOSIS — E2839 Other primary ovarian failure: Secondary | ICD-10-CM

## 2020-10-05 ENCOUNTER — Encounter: Payer: Self-pay | Admitting: Hematology and Oncology

## 2020-10-05 DIAGNOSIS — M85852 Other specified disorders of bone density and structure, left thigh: Secondary | ICD-10-CM | POA: Diagnosis not present

## 2020-10-05 DIAGNOSIS — Z780 Asymptomatic menopausal state: Secondary | ICD-10-CM | POA: Diagnosis not present

## 2020-11-22 DIAGNOSIS — Z8249 Family history of ischemic heart disease and other diseases of the circulatory system: Secondary | ICD-10-CM | POA: Diagnosis not present

## 2020-11-22 DIAGNOSIS — R030 Elevated blood-pressure reading, without diagnosis of hypertension: Secondary | ICD-10-CM | POA: Diagnosis not present

## 2020-11-22 DIAGNOSIS — Z809 Family history of malignant neoplasm, unspecified: Secondary | ICD-10-CM | POA: Diagnosis not present

## 2020-11-22 DIAGNOSIS — Z833 Family history of diabetes mellitus: Secondary | ICD-10-CM | POA: Diagnosis not present

## 2020-11-22 DIAGNOSIS — Z79811 Long term (current) use of aromatase inhibitors: Secondary | ICD-10-CM | POA: Diagnosis not present

## 2020-11-22 DIAGNOSIS — E785 Hyperlipidemia, unspecified: Secondary | ICD-10-CM | POA: Diagnosis not present

## 2020-11-22 DIAGNOSIS — J309 Allergic rhinitis, unspecified: Secondary | ICD-10-CM | POA: Diagnosis not present

## 2020-11-22 DIAGNOSIS — C50919 Malignant neoplasm of unspecified site of unspecified female breast: Secondary | ICD-10-CM | POA: Diagnosis not present

## 2020-11-22 DIAGNOSIS — Z7982 Long term (current) use of aspirin: Secondary | ICD-10-CM | POA: Diagnosis not present

## 2020-12-05 ENCOUNTER — Other Ambulatory Visit (HOSPITAL_COMMUNITY): Payer: Self-pay

## 2020-12-05 MED FILL — Anastrozole Tab 1 MG: ORAL | 90 days supply | Qty: 90 | Fill #2 | Status: AC

## 2020-12-24 DIAGNOSIS — Z23 Encounter for immunization: Secondary | ICD-10-CM | POA: Diagnosis not present

## 2021-02-07 NOTE — Progress Notes (Signed)
Patient Care Team: Alroy Dust, L.Marlou Sa, MD as PCP - General (Family Medicine) Jovita Kussmaul, MD as Consulting Physician (General Surgery) Nicholas Lose, MD as Consulting Physician (Hematology and Oncology) Arloa Koh, MD (Inactive) as Consulting Physician (Radiation Oncology) Rockwell Germany, RN as Registered Nurse Mauro Kaufmann, RN as Registered Nurse Holley Bouche, NP (Inactive) as Nurse Practitioner (Nurse Practitioner) Sylvan Cheese, NP as Nurse Practitioner (Hematology and Oncology) Warren Danes, PA-C as Physician Assistant (Dermatology)  DIAGNOSIS:    ICD-10-CM   1. Malignant neoplasm of lower-outer quadrant of right breast of female, estrogen receptor positive ([place])  C50.511    Z17.0       SUMMARY OF ONCOLOGIC HISTORY: Oncology History  Breast cancer of lower-outer quadrant of right female breast ([place])  04/05/2014 Mammogram   Right breast: possible new masses warranting further imaging   04/19/2014 Breast US   Right breast: irregular hypoechoic mass in the 6 o'clock location, 4 cm from the nipple and posterior in location, measuring 0.9 x 0.6 x 0.6 cm. No other findings. No LAD.   04/30/2014 Initial Biopsy   Right breast biopsy 6:00: Invasive mammary cancer favor ductal, grade 2, ER+ (96%), PR+ (45%), HER-2 negative (ratio 1.03), Ki-67 49%.   05/11/2014 Breast MRI   Right breast 6:00 position: 0.9 x 0.7 x 0.6 cm mass, right breast 1 cm T2 hyperintense enhancing mass suggestive of benign fibroadenoma which has been stable   05/12/2014 Clinical Stage   Stage IA: T1b N0   06/24/2014 Definitive Surgery   Right lumpectomy/SLNB Marlou Starks): Invasive ductal carcinoma, grade 2, 0.9 cm, 3 LN removed and negative (0/3). DCIS, intermediate grade.    06/24/2014 Pathologic Stage   Stage IA: pT1b pN0   06/24/2014 Oncotype testing   Oncotype DX recurrence score 28, (18% ROR)    07/20/2014 - 09/21/2014 Chemotherapy   Adjuvant docetaxel and cyclophosphamide q 3 weeks  x 4 cycles   11/02/2014 - 12/07/2014 Radiation Therapy   Adjuvant RT Valere Dross): Right breast 50 Gy over 25 fractions, no boost      12/25/2014 -  Anti-estrogen oral therapy   Anastrozole 1 mg daily.  Planned duration of therapy 5 years.   01/11/2015 Survivorship   Survivorship care plan completed and mailed to patient in lieu of in person visit.     CHIEF COMPLIANT: Follow-up of breast cancer on anastrozole  INTERVAL HISTORY: Tammy Day is a 70 y.o. with above-mentioned history of right breast cancer treated with lumpectomy, adjuvant chemotherapy, radiation, and who is currently on anti-estrogen therapy with anastrozole. Mammogram on 09/22/2020 showed no evidence of malignancy bilaterally. She presents to the clinic today for annual follow-up.  She is just very stressed out about her husband's health who had a previous head and neck cancer.  She is complaining of a rash in her groin.  ALLERGIES:  is allergic to atorvastatin, nexium [esomeprazole magnesium], other, pravastatin, prilosec [omeprazole], and rabeprazole.  MEDICATIONS:  Current Outpatient Medications  Medication Sig Dispense Refill   anastrozole (ARIMIDEX) 1 MG tablet TAKE 1 TABLET BY MOUTH ONCE A DAY 90 tablet 3   Ascorbic Acid 125 MG CHEW Chew by mouth.     aspirin 81 MG tablet Take 81 mg by mouth daily.     azelastine (ASTELIN) 0.1 % nasal spray Place into both nostrils 2 (two) times daily as needed. Use in each nostril as directed     B Complex Vitamins (B COMPLEX 50) TABS Take 1 tablet by mouth daily.  0  betamethasone dipropionate 0.05 % cream Apply to lower leg daily 45 g 1   Calcium-Phosphorus-Vitamin D (CALCIUM/D3 ADULT GUMMIES PO) Take by mouth.     Cholecalciferol (VITAMIN D) 50 MCG (2000 UT) CAPS Take 1 capsule (2,000 Units total) by mouth daily. 30 capsule    Cranberry 180 MG CAPS Take 1 capsule by mouth daily.     Elderberry 575 MG/5ML SYRP Take 5 mLs by mouth daily.     ezetimibe (ZETIA) 10 MG tablet Take  1 tablet (10 mg total) by mouth daily.     Multiple Vitamins-Minerals (ONE-A-DAY VITACRAVES PO) Take by mouth daily.     Omega-3 Fatty Acids (FISH OIL) 1200 MG CAPS Take by mouth daily.     RABEprazole (ACIPHEX) 20 MG tablet Take 20 mg by mouth as needed.      rosuvastatin (CRESTOR) 10 MG tablet Take 10 mg by mouth 3 (three) times a week. Monday, Wednesday,Friday     Vaginal Lubricant (REPLENS) GEL Place 1 application vaginally daily.  0   No current facility-administered medications for this visit.    PHYSICAL EXAMINATION: ECOG PERFORMANCE STATUS: 1 - Symptomatic but completely ambulatory  Vitals:   02/08/21 0925  BP: (!) 144/80  Pulse: 88  Resp: 18  Temp: (!) 97.2 F (36.2 C)  SpO2: 95%   Filed Weights   02/08/21 0925  Weight: 159 lb 14.4 oz (72.5 kg)    BREAST: No palpable masses or nodules in either right or left breasts. No palpable axillary supraclavicular or infraclavicular adenopathy no breast tenderness or nipple discharge. (exam performed in the presence of a chaperone)  LABORATORY DATA:  I have reviewed the data as listed CMP Latest Ref Rng & Units 11/16/2014 09/21/2014 08/31/2014  Glucose 70 - 140 mg/dl 110 97 146(H)  BUN 7.0 - 26.0 mg/dL 13.9 13.9 14.6  Creatinine 0.6 - 1.1 mg/dL 0.8 0.7 0.7  Sodium 136 - 145 mEq/L 141 141 142  Potassium 3.5 - 5.1 mEq/L 4.3 3.7 3.3(L)  CO2 22 - 29 mEq/L 21(L) 21(L) 23  Calcium 8.4 - 10.4 mg/dL 9.4 9.2 9.5  Total Protein 6.4 - 8.3 g/dL 6.9 6.3(L) 7.1  Total Bilirubin 0.20 - 1.20 mg/dL 0.37 0.45 0.61  Alkaline Phos 40 - 150 U/L 52 50 57  AST 5 - 34 U/L '13 11 12  ' ALT 0 - 55 U/L '17 18 20    ' Lab Results  Component Value Date   WBC 4.0 11/16/2014   HGB 14.0 11/16/2014   HCT 42.4 11/16/2014   MCV 94.2 11/16/2014   PLT 266 11/16/2014   NEUTROABS 3.1 11/16/2014    ASSESSMENT & PLAN:  Breast cancer of lower-outer quadrant of right female breast Right breast invasive ductal carcinoma, grade 2, 0.9 cm, 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,  T1 BN 0M0 stage IA with intermediate grade DCIS, Oncotype recurrence score 28, 18% risk of recurrence with antiestrogen therapy alone intermediate risk.status post chemotherapy with Taxotere Cytoxan every 3 weeks 4 cycles started 07/20/2014 completed 09/21/2014 S/P adjuvant radiation completed 12/07/2014, started anastrozole 1 mg daily 12/25/2014   Anastrozole toxicities: Monitoring closely for anastrozole toxicities Hot flashes and myalgias resolved since she changed prescription to [place] is to continue the treatment for 7 years total   Profound vaginal dryness: Replens is working very well for her.  She uses it occasionally Inguinal rash:   Breast Cancer Surveillance: 1. Breast exam 02/08/2021: Benign 2. Mammogram 09/24/2020 no abnormalities. Postsurgical changes. Breast Density Category C.  Return to clinic in 1 year for follow-up    No orders of the defined types were placed in this encounter.  The patient has a good understanding of the overall plan. she agrees with it. she will call with any problems that may develop before the next visit here.  Total time spent: 20 mins including face to face time and time spent for planning, charting and coordination of care  Rulon Eisenmenger, MD, MPH 02/08/2021  I, Thana Ates, am acting as scribe for Dr. Nicholas Lose.  I have reviewed the above documentation for accuracy and completeness, and I agree with the above.

## 2021-02-08 ENCOUNTER — Other Ambulatory Visit: Payer: Self-pay

## 2021-02-08 ENCOUNTER — Inpatient Hospital Stay: Payer: Medicare PPO | Attending: Hematology and Oncology | Admitting: Hematology and Oncology

## 2021-02-08 DIAGNOSIS — R21 Rash and other nonspecific skin eruption: Secondary | ICD-10-CM | POA: Insufficient documentation

## 2021-02-08 DIAGNOSIS — Z9221 Personal history of antineoplastic chemotherapy: Secondary | ICD-10-CM | POA: Insufficient documentation

## 2021-02-08 DIAGNOSIS — E7800 Pure hypercholesterolemia, unspecified: Secondary | ICD-10-CM | POA: Diagnosis not present

## 2021-02-08 DIAGNOSIS — Z923 Personal history of irradiation: Secondary | ICD-10-CM | POA: Diagnosis not present

## 2021-02-08 DIAGNOSIS — R232 Flushing: Secondary | ICD-10-CM | POA: Insufficient documentation

## 2021-02-08 DIAGNOSIS — N6315 Unspecified lump in the right breast, overlapping quadrants: Secondary | ICD-10-CM | POA: Diagnosis not present

## 2021-02-08 DIAGNOSIS — Z170 Estrogen receptor positive status [ER+]: Secondary | ICD-10-CM | POA: Diagnosis not present

## 2021-02-08 DIAGNOSIS — Z79811 Long term (current) use of aromatase inhibitors: Secondary | ICD-10-CM | POA: Insufficient documentation

## 2021-02-08 DIAGNOSIS — C50511 Malignant neoplasm of lower-outer quadrant of right female breast: Secondary | ICD-10-CM | POA: Diagnosis not present

## 2021-02-08 DIAGNOSIS — R4589 Other symptoms and signs involving emotional state: Secondary | ICD-10-CM | POA: Diagnosis not present

## 2021-02-08 DIAGNOSIS — M791 Myalgia, unspecified site: Secondary | ICD-10-CM | POA: Insufficient documentation

## 2021-02-08 DIAGNOSIS — T451X5A Adverse effect of antineoplastic and immunosuppressive drugs, initial encounter: Secondary | ICD-10-CM | POA: Insufficient documentation

## 2021-02-08 DIAGNOSIS — Z0000 Encounter for general adult medical examination without abnormal findings: Secondary | ICD-10-CM | POA: Diagnosis not present

## 2021-02-08 DIAGNOSIS — J309 Allergic rhinitis, unspecified: Secondary | ICD-10-CM | POA: Diagnosis not present

## 2021-02-08 MED ORDER — TRIAMCINOLONE ACETONIDE 0.5 % EX OINT: 1.0000 "application " | TOPICAL_OINTMENT | Freq: Two times a day (BID) | CUTANEOUS | 0 refills | Status: DC | PRN

## 2021-02-08 NOTE — Progress Notes (Signed)
Dr. Payton Mccallum asked me to come in and see and examine Tammy Day today.  She notes some itching that is going on for the past 4 weeks over her labia and in her perineal area.  She does note that she has some vaginal dryness.  She is using Replens.  She is struggling with this.  I examined her and she has dryness noted along her outer labia majora down into her perineum.  There is a little bit of splitting of the skin in that area.  There are no ulcerations or vesicular areas of concern.  There is no discharge.  Everything else appears normal.  She has some vaginal dryness due to her antiestrogen therapy.  She also could have lichen sclerosis.  She and I talked about the following.  She will use vitamin E vaginal suppositories.  Additionally she will stop using the Replens.  I recommended that she get refresh intimate cleanser.  I also sent in triamcinolone ointment for her to apply.  If this continues we can set her up with gynecology or pelvic rehab.  Wilber Bihari, NP 02/08/21 3:58 PM Medical Oncology and Hematology [facility] [address] [place], [place] [address] Tel. [phone]    Fax. [phone]

## 2021-02-08 NOTE — Assessment & Plan Note (Signed)
Right breast invasive ductal carcinoma, grade 2, 0.9 cm, 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status post chemotherapy with Taxotere Cytoxan every 3 weeks 4 cycles started 07/20/2014 completed 09/21/2014 S/P adjuvant radiation completed 12/07/2014, started anastrozole 1 mg daily 12/25/2014  Anastrozole toxicities: Monitoring closely for anastrozole toxicities Hot flashes and myalgias resolved since she changed prescription to Alpha is to continue the treatment for 7 years total  Profound vaginal dryness: Replensis working very well for her.She uses it occasionally  Breast Cancer Surveillance: 1. Breast exam11/30/2022: Benign 2. Mammogram7/16/2022noabnormalities. Postsurgical changes. Breast Density Category C.    Return to clinic in1 year for follow-up

## 2021-02-23 ENCOUNTER — Other Ambulatory Visit (HOSPITAL_COMMUNITY): Payer: Self-pay

## 2021-02-23 MED FILL — Anastrozole Tab 1 MG: ORAL | 90 days supply | Qty: 90 | Fill #3 | Status: AC

## 2021-02-24 ENCOUNTER — Other Ambulatory Visit (HOSPITAL_COMMUNITY): Payer: Self-pay

## 2021-03-17 ENCOUNTER — Other Ambulatory Visit: Payer: Medicare PPO

## 2021-06-01 DIAGNOSIS — Z833 Family history of diabetes mellitus: Secondary | ICD-10-CM | POA: Diagnosis not present

## 2021-06-01 DIAGNOSIS — Z8249 Family history of ischemic heart disease and other diseases of the circulatory system: Secondary | ICD-10-CM | POA: Diagnosis not present

## 2021-06-01 DIAGNOSIS — Z7982 Long term (current) use of aspirin: Secondary | ICD-10-CM | POA: Diagnosis not present

## 2021-06-01 DIAGNOSIS — Z79811 Long term (current) use of aromatase inhibitors: Secondary | ICD-10-CM | POA: Diagnosis not present

## 2021-06-01 DIAGNOSIS — R32 Unspecified urinary incontinence: Secondary | ICD-10-CM | POA: Diagnosis not present

## 2021-06-01 DIAGNOSIS — E785 Hyperlipidemia, unspecified: Secondary | ICD-10-CM | POA: Diagnosis not present

## 2021-06-01 DIAGNOSIS — C50919 Malignant neoplasm of unspecified site of unspecified female breast: Secondary | ICD-10-CM | POA: Diagnosis not present

## 2021-06-01 DIAGNOSIS — Z823 Family history of stroke: Secondary | ICD-10-CM | POA: Diagnosis not present

## 2021-06-01 DIAGNOSIS — Z803 Family history of malignant neoplasm of breast: Secondary | ICD-10-CM | POA: Diagnosis not present

## 2021-06-20 ENCOUNTER — Other Ambulatory Visit (HOSPITAL_COMMUNITY): Payer: Self-pay

## 2021-06-20 ENCOUNTER — Other Ambulatory Visit: Payer: Self-pay | Admitting: Hematology and Oncology

## 2021-06-20 DIAGNOSIS — Z171 Estrogen receptor negative status [ER-]: Secondary | ICD-10-CM

## 2021-06-20 MED ORDER — ANASTROZOLE 1 MG PO TABS
ORAL_TABLET | Freq: Every day | ORAL | 3 refills | Status: DC
  Filled 2021-06-20: qty 90, 90d supply, fill #0
  Filled 2021-09-06: qty 90, 90d supply, fill #1
  Filled 2021-12-11: qty 90, 90d supply, fill #2

## 2021-06-21 ENCOUNTER — Other Ambulatory Visit (HOSPITAL_COMMUNITY): Payer: Self-pay

## 2021-06-22 ENCOUNTER — Ambulatory Visit: Payer: Medicare PPO | Admitting: Physician Assistant

## 2021-06-22 ENCOUNTER — Encounter: Payer: Self-pay | Admitting: Physician Assistant

## 2021-06-22 DIAGNOSIS — C44519 Basal cell carcinoma of skin of other part of trunk: Secondary | ICD-10-CM | POA: Diagnosis not present

## 2021-06-22 DIAGNOSIS — Z1283 Encounter for screening for malignant neoplasm of skin: Secondary | ICD-10-CM | POA: Diagnosis not present

## 2021-06-22 DIAGNOSIS — Z808 Family history of malignant neoplasm of other organs or systems: Secondary | ICD-10-CM

## 2021-06-22 DIAGNOSIS — Z86018 Personal history of other benign neoplasm: Secondary | ICD-10-CM

## 2021-06-22 DIAGNOSIS — D485 Neoplasm of uncertain behavior of skin: Secondary | ICD-10-CM

## 2021-06-22 NOTE — Patient Instructions (Signed)

## 2021-06-22 NOTE — Progress Notes (Addendum)
? ?  Follow-Up Visit ?  ?Subjective  ?Tammy Day is a 71 y.o. female who presents for the following: Annual Exam (Right neck- "dark" & left lower leg x 1 year- was checked last visit and not as big. Personal h/o atypical nevi, but no non mole skin cancer or melanoma. Family history of melanoma.  ). ? ? ?The following portions of the chart were reviewed this encounter and updated as appropriate:  Tobacco  Allergies  Meds  Problems  Med Hx  Surg Hx  Fam Hx   ?  ? ?Objective  ?Well appearing patient in no apparent distress; mood and affect are within normal limits. ? ?A full examination was performed including scalp, head, eyes, ears, nose, lips, neck, chest, axillae, abdomen, back, buttocks, bilateral upper extremities, bilateral lower extremities, hands, feet, fingers, toes, fingernails, and toenails. All findings within normal limits unless otherwise noted below. ? ?No atypical nevi No signs of non-mole skin cancer.  ? ?Left Lower Back ?Thick pink plaque ? ? ? ? ? ? ? ?Assessment & Plan  ?Encounter for screening for malignant neoplasm of skin ? ?Yearly skin examinations ? ?BCC (basal cell carcinoma), back ?Left Lower Back ? ?Epidermal / dermal shaving ? ?Lesion diameter (cm):  1.3 ?Informed consent: discussed and consent obtained   ?Timeout: patient name, date of birth, surgical site, and procedure verified   ?Procedure prep:  Patient was prepped and draped in usual sterile fashion ?Prep type:  Chlorhexidine ?Anesthesia: the lesion was anesthetized in a standard fashion   ?Anesthetic:  1% lidocaine w/ epinephrine 1-100,000 local infiltration ?Instrument used: DermaBlade   ?Hemostasis achieved with: aluminum chloride   ?Outcome: patient tolerated procedure well   ?Post-procedure details: sterile dressing applied and wound care instructions given   ?Dressing type: petrolatum gauze, petrolatum and bandage   ? ?Specimen 1 - Surgical pathology ?Differential Diagnosis: bcc vs scc ?Cautery  only ?Check Margins: yes ? ? ? ?I, Kamree Wiens, PA-C, have reviewed all documentation's for this visit.  The documentation on 06/27/21 for the exam, diagnosis, procedures and orders are all accurate and complete. ?

## 2021-06-27 ENCOUNTER — Telehealth: Payer: Self-pay | Admitting: *Deleted

## 2021-06-27 NOTE — Addendum Note (Signed)
Addended by: Robyne Askew R on: 06/27/2021 01:28 PM ? ? Modules accepted: Orders ? ?

## 2021-06-27 NOTE — Telephone Encounter (Signed)
Path to patient. Patient will call back to schedule her 9 month follow up.  ?

## 2021-06-27 NOTE — Telephone Encounter (Signed)
-----   Message from Warren Danes, [place] sent at 06/27/2021  1:28 PM EDT ----- ?Negative margins. RTC if recurs. Recheck 9 months ?

## 2021-09-06 ENCOUNTER — Other Ambulatory Visit (HOSPITAL_COMMUNITY): Payer: Self-pay

## 2021-10-12 ENCOUNTER — Other Ambulatory Visit: Payer: Self-pay | Admitting: Family Medicine

## 2021-10-12 DIAGNOSIS — Z1231 Encounter for screening mammogram for malignant neoplasm of breast: Secondary | ICD-10-CM

## 2021-10-27 ENCOUNTER — Ambulatory Visit: Payer: Medicare PPO

## 2021-10-30 ENCOUNTER — Ambulatory Visit: Payer: Medicare PPO

## 2021-11-10 ENCOUNTER — Ambulatory Visit
Admission: RE | Admit: 2021-11-10 | Discharge: 2021-11-10 | Disposition: A | Discharge status: Home / Self Care | Payer: Medicare PPO | Source: Ambulatory Visit | Attending: Family Medicine | Admitting: Family Medicine

## 2021-11-10 DIAGNOSIS — Z1231 Encounter for screening mammogram for malignant neoplasm of breast: Secondary | ICD-10-CM

## 2021-12-11 ENCOUNTER — Other Ambulatory Visit (HOSPITAL_COMMUNITY): Payer: Self-pay

## 2021-12-14 NOTE — Progress Notes (Signed)
 Patient Care Team: Mitchell, L.Dean, MD as PCP - General (Family Medicine) Toth, Paul III, MD as Consulting Physician (General Surgery) Gudena, Vinay, MD as Consulting Physician (Hematology and Oncology) Murray, Robert, MD (Inactive) as Consulting Physician (Radiation Oncology) Martini, Keisha N, RN as Registered Nurse Stuart, Dawn C, RN as Registered Nurse Dawson, Gretchen W, NP (Inactive) as Nurse Practitioner (Nurse Practitioner) Mackey, Heather Thompson, NP as Nurse Practitioner (Hematology and Oncology) Sheffield, Kelli R, PA-C as Physician Assistant (Dermatology)  DIAGNOSIS:  Encounter Diagnosis  Name Primary?   Malignant neoplasm of lower-outer quadrant of right breast of female, estrogen receptor positive ([facility])     SUMMARY OF ONCOLOGIC HISTORY: Oncology History  Breast cancer of lower-outer quadrant of right female breast ([facility])  04/05/2014 Mammogram   Right breast: possible new masses warranting further imaging   04/19/2014 Breast US   Right breast: irregular hypoechoic mass in the 6 o'clock location, 4 cm from the nipple and posterior in location, measuring 0.9 x 0.6 x 0.6 cm. No other findings. No LAD.   04/30/2014 Initial Biopsy   Right breast biopsy 6:00: Invasive mammary cancer favor ductal, grade 2, ER+ (96%), PR+ (45%), HER-2 negative (ratio 1.03), Ki-67 49%.   05/11/2014 Breast MRI   Right breast 6:00 position: 0.9 x 0.7 x 0.6 cm mass, right breast 1 cm T2 hyperintense enhancing mass suggestive of benign fibroadenoma which has been stable   05/12/2014 Clinical Stage   Stage IA: T1b N0   06/24/2014 Definitive Surgery   Right lumpectomy/SLNB (Toth): Invasive ductal carcinoma, grade 2, 0.9 cm, 3 LN removed and negative (0/3). DCIS, intermediate grade.    06/24/2014 Pathologic Stage   Stage IA: pT1b pN0   06/24/2014 Oncotype testing   Oncotype DX recurrence score 28, (18% ROR)    07/20/2014 - 09/21/2014 Chemotherapy   Adjuvant docetaxel and cyclophosphamide q 3 weeks x  4 cycles   11/02/2014 - 12/07/2014 Radiation Therapy   Adjuvant RT (Murray): Right breast 50 Gy over 25 fractions, no boost      12/25/2014 - 12/19/2021 Anti-estrogen oral therapy   Anastrozole 1 mg daily x7 years   01/11/2015 Survivorship   Survivorship care plan completed and mailed to patient in lieu of in person visit.     CHIEF COMPLIANT: Follow-up of breast cancer on anastrozole  INTERVAL HISTORY: Tammy Day is a 71 y.o. with above-mentioned history of right breast cancer treated with lumpectomy, adjuvant chemotherapy, radiation, and who is currently on anti-estrogen therapy with anastrozole. She presents to the clinic for a follow-up. She reports health has been o.k She states that the rash comes and goes. She does yoga once a week and she walks for exercise.    ALLERGIES:  is allergic to atorvastatin, nexium [esomeprazole magnesium], other, pravastatin, prilosec [omeprazole], and rabeprazole.  MEDICATIONS:  Current Outpatient Medications  Medication Sig Dispense Refill   Ascorbic Acid 125 MG CHEW Chew by mouth.     aspirin 81 MG tablet Take 81 mg by mouth daily.     azelastine (ASTELIN) 0.1 % nasal spray Place into both nostrils 2 (two) times daily as needed. Use in each nostril as directed     B Complex Vitamins (B COMPLEX 50) TABS Take 1 tablet by mouth daily.  0   Calcium-Phosphorus-Vitamin D (CALCIUM/D3 ADULT GUMMIES PO) Take by mouth.     Cholecalciferol (VITAMIN D) 50 MCG (2000 UT) CAPS Take 1 capsule (2,000 Units total) by mouth daily. 30 capsule    Cranberry 180 MG CAPS Take 1   capsule by mouth daily.     Elderberry 575 MG/5ML SYRP Take 5 mLs by mouth daily.     ezetimibe (ZETIA) 10 MG tablet Take 1 tablet (10 mg total) by mouth daily.     Multiple Vitamins-Minerals (ONE-A-DAY VITACRAVES PO) Take by mouth daily.     Omega-3 Fatty Acids (FISH OIL) 1200 MG CAPS Take by mouth daily.     RABEprazole (ACIPHEX) 20 MG tablet Take 20 mg by mouth as needed.       rosuvastatin (CRESTOR) 10 MG tablet Take 10 mg by mouth 3 (three) times a week. Monday, Wednesday,Friday     Vaginal Lubricant (REPLENS) GEL Place 1 application vaginally daily.  0   No current facility-administered medications for this visit.    PHYSICAL EXAMINATION: ECOG PERFORMANCE STATUS: 1 - Symptomatic but completely ambulatory  Vitals:   12/19/21 1054  BP: (!) 150/85  Pulse: 80  Resp: 17  Temp: 97.7 F (36.5 C)  SpO2: 97%   Filed Weights   12/19/21 1054  Weight: 160 lb 9.6 oz (72.8 kg)     LABORATORY DATA:  I have reviewed the data as listed    Latest Ref Rng & Units 11/16/2014    8:49 AM 09/21/2014   10:38 AM 08/31/2014    1:23 PM  CMP  Glucose 70 - 140 mg/dl 110  97  146   BUN 7.0 - 26.0 mg/dL 13.9  13.9  14.6   Creatinine 0.6 - 1.1 mg/dL 0.8  0.7  0.7   Sodium 136 - 145 mEq/L 141  141  142   Potassium 3.5 - 5.1 mEq/L 4.3  3.7  3.3   CO2 22 - 29 mEq/L 21  21  23   Calcium 8.4 - 10.4 mg/dL 9.4  9.2  9.5   Total Protein 6.4 - 8.3 g/dL 6.9  6.3  7.1   Total Bilirubin 0.20 - 1.20 mg/dL 0.37  0.45  0.61   Alkaline Phos 40 - 150 U/L 52  50  57   AST 5 - 34 U/L 13  11  12   ALT 0 - 55 U/L 17  18  20     Lab Results  Component Value Date   WBC 4.0 11/16/2014   HGB 14.0 11/16/2014   HCT 42.4 11/16/2014   MCV 94.2 11/16/2014   PLT 266 11/16/2014   NEUTROABS 3.1 11/16/2014    ASSESSMENT & PLAN:  Breast cancer of lower-outer quadrant of right female breast Right breast invasive ductal carcinoma, grade 2, 0.9 cm, 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status post chemotherapy with Taxotere Cytoxan every 3 weeks 4 cycles started 07/20/2014 completed 09/21/2014 S/P adjuvant radiation completed 12/07/2014, started anastrozole 1 mg daily 12/25/2014 completed 12/19/2021   Anastrozole toxicities: Tolerated it well.  She completed 7  years of therapy and therefore we recommended discontinuation of anastrozole at this time.   Profound vaginal dryness: Replens is working very well for her.  She uses it occasionally Inguinal rash: Triamcinolone ointment is helping her.  I sent a refill for that today.     Breast Cancer Surveillance: Mammogram 11/15/2021. Postsurgical changes. Breast Density Category C.     Return to clinic on an as needed basis    No orders of the defined types were placed in this encounter.  The patient has a good understanding of the overall plan. she agrees   with it. she will call with any problems that may develop before the next visit here. Total time spent: 30 mins including face to face time and time spent for planning, charting and co-ordination of care   Harriette Ohara, MD 12/19/21    I Gardiner Coins am scribing for Dr. Lindi Adie  I have reviewed the above documentation for accuracy and completeness, and I agree with the above.

## 2021-12-19 ENCOUNTER — Inpatient Hospital Stay: Payer: Medicare PPO | Attending: Hematology and Oncology | Admitting: Hematology and Oncology

## 2021-12-19 ENCOUNTER — Other Ambulatory Visit: Payer: Self-pay

## 2021-12-19 DIAGNOSIS — C50511 Malignant neoplasm of lower-outer quadrant of right female breast: Secondary | ICD-10-CM | POA: Diagnosis not present

## 2021-12-19 DIAGNOSIS — Z170 Estrogen receptor positive status [ER+]: Secondary | ICD-10-CM | POA: Insufficient documentation

## 2021-12-19 DIAGNOSIS — Z79899 Other long term (current) drug therapy: Secondary | ICD-10-CM | POA: Insufficient documentation

## 2021-12-19 DIAGNOSIS — Z79811 Long term (current) use of aromatase inhibitors: Secondary | ICD-10-CM | POA: Diagnosis not present

## 2021-12-19 DIAGNOSIS — R21 Rash and other nonspecific skin eruption: Secondary | ICD-10-CM | POA: Diagnosis not present

## 2021-12-19 MED ORDER — TRIAMCINOLONE ACETONIDE 0.5 % EX OINT: 1.0000 | TOPICAL_OINTMENT | Freq: Two times a day (BID) | CUTANEOUS | 6 refills | Status: DC | PRN

## 2021-12-19 NOTE — Assessment & Plan Note (Addendum)
Right breast invasive ductal carcinoma, grade 2, 0.9 cm, 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status post chemotherapy with Taxotere Cytoxan every 3 weeks 4 cycles started 07/20/2014 completed 09/21/2014 S/P adjuvant radiation completed 12/07/2014, started anastrozole 1 mg daily 12/25/2014 completed 12/19/2021  Anastrozole toxicities: Tolerated it well.  She completed 7 years of therapy and therefore we recommended discontinuation of anastrozole at this time.  Profound vaginal dryness: Replensis working very well for her.She uses it occasionally Inguinal rash: Triamcinolone ointment is helping her.  I sent a refill for that today.   Breast Cancer Surveillance: Mammogram9/08/2021. Postsurgical changes. Breast Density Category C.   Return to clinic on an as needed basis

## 2022-03-14 DIAGNOSIS — E7800 Pure hypercholesterolemia, unspecified: Secondary | ICD-10-CM | POA: Diagnosis not present

## 2022-03-14 DIAGNOSIS — Z91030 Bee allergy status: Secondary | ICD-10-CM | POA: Diagnosis not present

## 2022-03-14 DIAGNOSIS — I10 Essential (primary) hypertension: Secondary | ICD-10-CM | POA: Diagnosis not present

## 2022-03-14 DIAGNOSIS — G4730 Sleep apnea, unspecified: Secondary | ICD-10-CM | POA: Diagnosis not present

## 2022-03-14 DIAGNOSIS — G4700 Insomnia, unspecified: Secondary | ICD-10-CM | POA: Diagnosis not present

## 2022-03-27 ENCOUNTER — Ambulatory Visit: Payer: Medicare PPO | Admitting: Physician Assistant

## 2022-04-03 DIAGNOSIS — G4719 Other hypersomnia: Secondary | ICD-10-CM | POA: Diagnosis not present

## 2022-04-30 ENCOUNTER — Other Ambulatory Visit (HOSPITAL_COMMUNITY): Payer: Self-pay

## 2022-05-16 DIAGNOSIS — G4733 Obstructive sleep apnea (adult) (pediatric): Secondary | ICD-10-CM | POA: Diagnosis not present

## 2022-06-05 ENCOUNTER — Encounter: Payer: Self-pay | Admitting: Hematology and Oncology

## 2022-06-05 DIAGNOSIS — L821 Other seborrheic keratosis: Secondary | ICD-10-CM | POA: Diagnosis not present

## 2022-06-05 DIAGNOSIS — L578 Other skin changes due to chronic exposure to nonionizing radiation: Secondary | ICD-10-CM | POA: Diagnosis not present

## 2022-06-05 DIAGNOSIS — L814 Other melanin hyperpigmentation: Secondary | ICD-10-CM | POA: Diagnosis not present

## 2022-06-05 DIAGNOSIS — L720 Epidermal cyst: Secondary | ICD-10-CM | POA: Diagnosis not present

## 2022-06-05 DIAGNOSIS — D229 Melanocytic nevi, unspecified: Secondary | ICD-10-CM | POA: Diagnosis not present

## 2022-06-05 DIAGNOSIS — D1801 Hemangioma of skin and subcutaneous tissue: Secondary | ICD-10-CM | POA: Diagnosis not present

## 2022-06-05 DIAGNOSIS — R234 Changes in skin texture: Secondary | ICD-10-CM | POA: Diagnosis not present

## 2022-06-14 DIAGNOSIS — G4733 Obstructive sleep apnea (adult) (pediatric): Secondary | ICD-10-CM | POA: Diagnosis not present

## 2022-06-28 DIAGNOSIS — G4733 Obstructive sleep apnea (adult) (pediatric): Secondary | ICD-10-CM | POA: Diagnosis not present

## 2022-06-29 DIAGNOSIS — H35433 Paving stone degeneration of retina, bilateral: Secondary | ICD-10-CM | POA: Diagnosis not present

## 2022-06-29 DIAGNOSIS — H52203 Unspecified astigmatism, bilateral: Secondary | ICD-10-CM | POA: Diagnosis not present

## 2022-07-30 DIAGNOSIS — H6690 Otitis media, unspecified, unspecified ear: Secondary | ICD-10-CM | POA: Diagnosis not present

## 2022-07-30 DIAGNOSIS — Z6827 Body mass index (BMI) 27.0-27.9, adult: Secondary | ICD-10-CM | POA: Diagnosis not present

## 2022-07-30 DIAGNOSIS — J069 Acute upper respiratory infection, unspecified: Secondary | ICD-10-CM | POA: Diagnosis not present

## 2022-08-07 DIAGNOSIS — Z6827 Body mass index (BMI) 27.0-27.9, adult: Secondary | ICD-10-CM | POA: Diagnosis not present

## 2022-08-07 DIAGNOSIS — H6982 Other specified disorders of Eustachian tube, left ear: Secondary | ICD-10-CM | POA: Diagnosis not present

## 2022-08-07 DIAGNOSIS — Z0000 Encounter for general adult medical examination without abnormal findings: Secondary | ICD-10-CM | POA: Diagnosis not present

## 2022-08-23 DIAGNOSIS — H938X3 Other specified disorders of ear, bilateral: Secondary | ICD-10-CM | POA: Diagnosis not present

## 2022-08-23 DIAGNOSIS — H93293 Other abnormal auditory perceptions, bilateral: Secondary | ICD-10-CM | POA: Diagnosis not present

## 2022-08-26 DIAGNOSIS — E785 Hyperlipidemia, unspecified: Secondary | ICD-10-CM | POA: Diagnosis not present

## 2022-08-26 DIAGNOSIS — L309 Dermatitis, unspecified: Secondary | ICD-10-CM | POA: Diagnosis not present

## 2022-08-26 DIAGNOSIS — Z8249 Family history of ischemic heart disease and other diseases of the circulatory system: Secondary | ICD-10-CM | POA: Diagnosis not present

## 2022-08-26 DIAGNOSIS — R32 Unspecified urinary incontinence: Secondary | ICD-10-CM | POA: Diagnosis not present

## 2022-08-26 DIAGNOSIS — I10 Essential (primary) hypertension: Secondary | ICD-10-CM | POA: Diagnosis not present

## 2022-08-26 DIAGNOSIS — Z823 Family history of stroke: Secondary | ICD-10-CM | POA: Diagnosis not present

## 2022-08-26 DIAGNOSIS — G4733 Obstructive sleep apnea (adult) (pediatric): Secondary | ICD-10-CM | POA: Diagnosis not present

## 2022-08-26 DIAGNOSIS — Z809 Family history of malignant neoplasm, unspecified: Secondary | ICD-10-CM | POA: Diagnosis not present

## 2022-08-26 DIAGNOSIS — M8580 Other specified disorders of bone density and structure, unspecified site: Secondary | ICD-10-CM | POA: Diagnosis not present

## 2022-09-04 ENCOUNTER — Other Ambulatory Visit: Payer: Self-pay | Admitting: Family Medicine

## 2022-09-04 DIAGNOSIS — Z1231 Encounter for screening mammogram for malignant neoplasm of breast: Secondary | ICD-10-CM

## 2022-10-04 DIAGNOSIS — H903 Sensorineural hearing loss, bilateral: Secondary | ICD-10-CM | POA: Diagnosis not present

## 2022-10-04 DIAGNOSIS — H6990 Unspecified Eustachian tube disorder, unspecified ear: Secondary | ICD-10-CM | POA: Diagnosis not present

## 2022-10-04 DIAGNOSIS — H9193 Unspecified hearing loss, bilateral: Secondary | ICD-10-CM | POA: Diagnosis not present

## 2022-11-13 ENCOUNTER — Ambulatory Visit
Admission: RE | Admit: 2022-11-13 | Discharge: 2022-11-13 | Disposition: A | Discharge status: Home / Self Care | Payer: Medicare PPO | Source: Ambulatory Visit | Attending: Family Medicine | Admitting: Family Medicine

## 2022-11-13 DIAGNOSIS — Z1231 Encounter for screening mammogram for malignant neoplasm of breast: Secondary | ICD-10-CM | POA: Diagnosis not present

## 2023-01-29 DIAGNOSIS — N898 Other specified noninflammatory disorders of vagina: Secondary | ICD-10-CM | POA: Diagnosis not present

## 2023-01-29 DIAGNOSIS — M25512 Pain in left shoulder: Secondary | ICD-10-CM | POA: Diagnosis not present

## 2023-01-29 DIAGNOSIS — G5711 Meralgia paresthetica, right lower limb: Secondary | ICD-10-CM | POA: Diagnosis not present

## 2023-01-29 DIAGNOSIS — I77810 Thoracic aortic ectasia: Secondary | ICD-10-CM | POA: Diagnosis not present

## 2023-02-04 DIAGNOSIS — N951 Menopausal and female climacteric states: Secondary | ICD-10-CM | POA: Diagnosis not present

## 2023-02-04 DIAGNOSIS — Z853 Personal history of malignant neoplasm of breast: Secondary | ICD-10-CM | POA: Diagnosis not present

## 2023-02-22 DIAGNOSIS — I7121 Aneurysm of the ascending aorta, without rupture: Secondary | ICD-10-CM | POA: Diagnosis not present

## 2023-03-27 ENCOUNTER — Other Ambulatory Visit: Payer: Self-pay | Admitting: Medical Genetics

## 2023-04-16 ENCOUNTER — Other Ambulatory Visit (HOSPITAL_COMMUNITY)
Admission: RE | Admit: 2023-04-16 | Discharge: 2023-04-16 | Disposition: A | Discharge status: Home / Self Care | Payer: Self-pay | Source: Ambulatory Visit | Attending: Medical Genetics | Admitting: Medical Genetics

## 2023-04-28 LAB — GENECONNECT MOLECULAR SCREEN: Genetic Analysis Overall Interpretation: NEGATIVE

## 2023-06-13 DIAGNOSIS — R739 Hyperglycemia, unspecified: Secondary | ICD-10-CM | POA: Diagnosis not present

## 2023-07-02 DIAGNOSIS — E119 Type 2 diabetes mellitus without complications: Secondary | ICD-10-CM | POA: Diagnosis not present

## 2023-08-02 DIAGNOSIS — H52203 Unspecified astigmatism, bilateral: Secondary | ICD-10-CM | POA: Diagnosis not present

## 2023-08-02 DIAGNOSIS — H5203 Hypermetropia, bilateral: Secondary | ICD-10-CM | POA: Diagnosis not present

## 2023-08-02 DIAGNOSIS — E119 Type 2 diabetes mellitus without complications: Secondary | ICD-10-CM | POA: Diagnosis not present

## 2023-08-02 DIAGNOSIS — H40022 Open angle with borderline findings, high risk, left eye: Secondary | ICD-10-CM | POA: Diagnosis not present

## 2023-08-12 DIAGNOSIS — I5189 Other ill-defined heart diseases: Secondary | ICD-10-CM | POA: Diagnosis not present

## 2023-08-12 DIAGNOSIS — E119 Type 2 diabetes mellitus without complications: Secondary | ICD-10-CM | POA: Diagnosis not present

## 2023-08-12 DIAGNOSIS — E7800 Pure hypercholesterolemia, unspecified: Secondary | ICD-10-CM | POA: Diagnosis not present

## 2023-09-24 DIAGNOSIS — E7800 Pure hypercholesterolemia, unspecified: Secondary | ICD-10-CM | POA: Diagnosis not present

## 2023-09-24 DIAGNOSIS — R079 Chest pain, unspecified: Secondary | ICD-10-CM | POA: Diagnosis not present

## 2023-09-24 DIAGNOSIS — E119 Type 2 diabetes mellitus without complications: Secondary | ICD-10-CM | POA: Diagnosis not present

## 2023-09-24 DIAGNOSIS — E1165 Type 2 diabetes mellitus with hyperglycemia: Secondary | ICD-10-CM | POA: Diagnosis not present

## 2023-10-01 ENCOUNTER — Other Ambulatory Visit: Payer: Self-pay | Admitting: Family Medicine

## 2023-10-01 DIAGNOSIS — Z1231 Encounter for screening mammogram for malignant neoplasm of breast: Secondary | ICD-10-CM

## 2023-10-16 DIAGNOSIS — H00015 Hordeolum externum left lower eyelid: Secondary | ICD-10-CM | POA: Diagnosis not present

## 2023-11-14 ENCOUNTER — Ambulatory Visit
Admission: RE | Admit: 2023-11-14 | Discharge: 2023-11-14 | Disposition: A | Discharge status: Home / Self Care | Source: Ambulatory Visit | Attending: Family Medicine

## 2023-11-14 DIAGNOSIS — Z1231 Encounter for screening mammogram for malignant neoplasm of breast: Secondary | ICD-10-CM

## 2024-01-27 DIAGNOSIS — H401221 Low-tension glaucoma, left eye, mild stage: Secondary | ICD-10-CM | POA: Diagnosis not present

## 2024-03-26 ENCOUNTER — Other Ambulatory Visit (HOSPITAL_BASED_OUTPATIENT_CLINIC_OR_DEPARTMENT_OTHER): Payer: Self-pay | Admitting: Family Medicine

## 2024-03-26 DIAGNOSIS — M8588 Other specified disorders of bone density and structure, other site: Secondary | ICD-10-CM
